# Patient Record
Sex: Female | Born: 1945 | Race: White | Hispanic: No | Marital: Married | Smoking: Former smoker
Health system: Southern US, Community
[De-identification: ages and names within clinical notes are randomized; demographics above are authoritative.]

## PROBLEM LIST (undated history)

## (undated) DIAGNOSIS — R112 Nausea with vomiting, unspecified: Secondary | ICD-10-CM

## (undated) DIAGNOSIS — M549 Dorsalgia, unspecified: Secondary | ICD-10-CM

## (undated) DIAGNOSIS — M25511 Pain in right shoulder: Secondary | ICD-10-CM

## (undated) DIAGNOSIS — G5600 Carpal tunnel syndrome, unspecified upper limb: Secondary | ICD-10-CM

## (undated) DIAGNOSIS — D494 Neoplasm of unspecified behavior of bladder: Secondary | ICD-10-CM

## (undated) DIAGNOSIS — I2510 Atherosclerotic heart disease of native coronary artery without angina pectoris: Secondary | ICD-10-CM

## (undated) DIAGNOSIS — R350 Frequency of micturition: Secondary | ICD-10-CM

## (undated) DIAGNOSIS — R351 Nocturia: Secondary | ICD-10-CM

## (undated) DIAGNOSIS — E538 Deficiency of other specified B group vitamins: Secondary | ICD-10-CM

## (undated) DIAGNOSIS — K760 Fatty (change of) liver, not elsewhere classified: Secondary | ICD-10-CM

## (undated) DIAGNOSIS — C801 Malignant (primary) neoplasm, unspecified: Secondary | ICD-10-CM

## (undated) DIAGNOSIS — E119 Type 2 diabetes mellitus without complications: Secondary | ICD-10-CM

## (undated) DIAGNOSIS — R519 Headache, unspecified: Secondary | ICD-10-CM

## (undated) DIAGNOSIS — N6019 Diffuse cystic mastopathy of unspecified breast: Secondary | ICD-10-CM

## (undated) DIAGNOSIS — T8859XA Other complications of anesthesia, initial encounter: Secondary | ICD-10-CM

## (undated) DIAGNOSIS — R7303 Prediabetes: Secondary | ICD-10-CM

## (undated) DIAGNOSIS — E782 Mixed hyperlipidemia: Secondary | ICD-10-CM

## (undated) DIAGNOSIS — M47819 Spondylosis without myelopathy or radiculopathy, site unspecified: Secondary | ICD-10-CM

## (undated) DIAGNOSIS — Z8719 Personal history of other diseases of the digestive system: Secondary | ICD-10-CM

## (undated) DIAGNOSIS — Z8551 Personal history of malignant neoplasm of bladder: Secondary | ICD-10-CM

## (undated) DIAGNOSIS — T884XXA Failed or difficult intubation, initial encounter: Secondary | ICD-10-CM

## (undated) DIAGNOSIS — R32 Unspecified urinary incontinence: Secondary | ICD-10-CM

## (undated) DIAGNOSIS — K449 Diaphragmatic hernia without obstruction or gangrene: Secondary | ICD-10-CM

## (undated) DIAGNOSIS — C679 Malignant neoplasm of bladder, unspecified: Secondary | ICD-10-CM

## (undated) DIAGNOSIS — G8929 Other chronic pain: Secondary | ICD-10-CM

## (undated) DIAGNOSIS — K219 Gastro-esophageal reflux disease without esophagitis: Secondary | ICD-10-CM

## (undated) DIAGNOSIS — K661 Hemoperitoneum: Secondary | ICD-10-CM

## (undated) DIAGNOSIS — F419 Anxiety disorder, unspecified: Secondary | ICD-10-CM

## (undated) DIAGNOSIS — I1A0 Resistant hypertension: Secondary | ICD-10-CM

## (undated) DIAGNOSIS — H269 Unspecified cataract: Secondary | ICD-10-CM

## (undated) DIAGNOSIS — J189 Pneumonia, unspecified organism: Secondary | ICD-10-CM

## (undated) DIAGNOSIS — R159 Full incontinence of feces: Secondary | ICD-10-CM

## (undated) DIAGNOSIS — I493 Ventricular premature depolarization: Secondary | ICD-10-CM

## (undated) DIAGNOSIS — L409 Psoriasis, unspecified: Secondary | ICD-10-CM

## (undated) DIAGNOSIS — Z889 Allergy status to unspecified drugs, medicaments and biological substances status: Secondary | ICD-10-CM

## (undated) DIAGNOSIS — H53123 Transient visual loss, bilateral: Secondary | ICD-10-CM

## (undated) DIAGNOSIS — M1990 Unspecified osteoarthritis, unspecified site: Secondary | ICD-10-CM

## (undated) DIAGNOSIS — I10 Essential (primary) hypertension: Secondary | ICD-10-CM

## (undated) DIAGNOSIS — I739 Peripheral vascular disease, unspecified: Secondary | ICD-10-CM

## (undated) DIAGNOSIS — Z955 Presence of coronary angioplasty implant and graft: Secondary | ICD-10-CM

## (undated) DIAGNOSIS — R399 Unspecified symptoms and signs involving the genitourinary system: Secondary | ICD-10-CM

## (undated) DIAGNOSIS — M545 Low back pain, unspecified: Secondary | ICD-10-CM

## (undated) DIAGNOSIS — Z9889 Other specified postprocedural states: Secondary | ICD-10-CM

## (undated) DIAGNOSIS — K2270 Barrett's esophagus without dysplasia: Secondary | ICD-10-CM

## (undated) DIAGNOSIS — K5790 Diverticulosis of intestine, part unspecified, without perforation or abscess without bleeding: Secondary | ICD-10-CM

## (undated) DIAGNOSIS — I679 Cerebrovascular disease, unspecified: Secondary | ICD-10-CM

## (undated) DIAGNOSIS — E785 Hyperlipidemia, unspecified: Secondary | ICD-10-CM

## (undated) HISTORY — PX: CARDIAC CATHETERIZATION: SHX172

## (undated) HISTORY — PX: EYE SURGERY: SHX253

## (undated) HISTORY — PX: COLONOSCOPY: SHX174

## (undated) HISTORY — PX: CORONARY ANGIOPLASTY WITH STENT PLACEMENT: SHX49

## (undated) HISTORY — PX: BACK SURGERY: SHX140

## (undated) HISTORY — PX: APPENDECTOMY: SHX54

---
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
## 1974-05-10 HISTORY — PX: VAGINAL HYSTERECTOMY: SUR661

## 1988-05-10 HISTORY — PX: CHOLECYSTECTOMY: SHX55

## 1988-05-10 HISTORY — PX: CHOLECYSTECTOMY OPEN: SUR202

## 2003-05-11 HISTORY — PX: HAND SURGERY: SHX662

## 2003-05-11 HISTORY — PX: CARPAL TUNNEL RELEASE: SHX101

## 2004-10-01 ENCOUNTER — Ambulatory Visit: Payer: Self-pay | Admitting: Internal Medicine

## 2004-11-18 ENCOUNTER — Ambulatory Visit: Payer: Self-pay | Admitting: Unknown Physician Specialty

## 2005-06-30 ENCOUNTER — Ambulatory Visit: Payer: Self-pay | Admitting: Internal Medicine

## 2007-02-08 HISTORY — PX: CORONARY ANGIOPLASTY WITH STENT PLACEMENT: SHX49

## 2007-03-01 ENCOUNTER — Other Ambulatory Visit: Payer: Self-pay

## 2007-03-01 ENCOUNTER — Inpatient Hospital Stay: Payer: Self-pay | Admitting: Internal Medicine

## 2007-07-24 ENCOUNTER — Ambulatory Visit: Payer: Self-pay | Admitting: Unknown Physician Specialty

## 2007-07-26 ENCOUNTER — Ambulatory Visit: Payer: Self-pay | Admitting: Unknown Physician Specialty

## 2008-01-09 HISTORY — PX: CORONARY ANGIOPLASTY WITH STENT PLACEMENT: SHX49

## 2008-09-17 HISTORY — PX: CARDIAC CATHETERIZATION: SHX172

## 2009-06-23 ENCOUNTER — Ambulatory Visit: Payer: Self-pay | Admitting: Internal Medicine

## 2009-07-10 HISTORY — PX: CAROTID ENDARTERECTOMY: SUR193

## 2009-07-16 ENCOUNTER — Emergency Department: Payer: Self-pay | Admitting: Unknown Physician Specialty

## 2010-12-08 ENCOUNTER — Ambulatory Visit: Payer: Self-pay | Admitting: Family Medicine

## 2011-01-06 ENCOUNTER — Ambulatory Visit: Payer: Self-pay | Admitting: Neurosurgery

## 2011-02-03 ENCOUNTER — Ambulatory Visit: Payer: Self-pay | Admitting: Neurosurgery

## 2011-05-11 DIAGNOSIS — Z8719 Personal history of other diseases of the digestive system: Secondary | ICD-10-CM

## 2011-05-11 HISTORY — DX: Personal history of other diseases of the digestive system: Z87.19

## 2011-05-26 ENCOUNTER — Encounter (HOSPITAL_COMMUNITY): Payer: Self-pay | Admitting: Pharmacy Technician

## 2011-06-04 ENCOUNTER — Encounter (HOSPITAL_COMMUNITY): Payer: Self-pay

## 2011-06-04 ENCOUNTER — Encounter (HOSPITAL_COMMUNITY)
Admission: RE | Admit: 2011-06-04 | Discharge: 2011-06-04 | Disposition: A | Discharge status: Home / Self Care | Payer: Medicare Other | Source: Ambulatory Visit | Attending: Anesthesiology | Admitting: Anesthesiology

## 2011-06-04 ENCOUNTER — Encounter (HOSPITAL_COMMUNITY)
Admission: RE | Admit: 2011-06-04 | Discharge: 2011-06-04 | Disposition: A | Discharge status: Home / Self Care | Payer: Medicare Other | Source: Ambulatory Visit | Attending: Neurosurgery | Admitting: Neurosurgery

## 2011-06-04 ENCOUNTER — Encounter (HOSPITAL_COMMUNITY): Payer: Self-pay | Admitting: Vascular Surgery

## 2011-06-04 HISTORY — DX: Diverticulosis of intestine, part unspecified, without perforation or abscess without bleeding: K57.90

## 2011-06-04 HISTORY — DX: Nocturia: R35.1

## 2011-06-04 HISTORY — DX: Frequency of micturition: R35.0

## 2011-06-04 HISTORY — DX: Psoriasis, unspecified: L40.9

## 2011-06-04 HISTORY — DX: Other specified postprocedural states: R11.2

## 2011-06-04 HISTORY — DX: Atherosclerotic heart disease of native coronary artery without angina pectoris: I25.10

## 2011-06-04 HISTORY — DX: Other specified postprocedural states: Z98.890

## 2011-06-04 HISTORY — DX: Essential (primary) hypertension: I10

## 2011-06-04 HISTORY — DX: Gastro-esophageal reflux disease without esophagitis: K21.9

## 2011-06-04 HISTORY — DX: Pneumonia, unspecified organism: J18.9

## 2011-06-04 HISTORY — DX: Other chronic pain: G89.29

## 2011-06-04 HISTORY — DX: Personal history of other diseases of the digestive system: Z87.19

## 2011-06-04 HISTORY — DX: Unspecified osteoarthritis, unspecified site: M19.90

## 2011-06-04 HISTORY — DX: Dorsalgia, unspecified: M54.9

## 2011-06-04 HISTORY — DX: Hyperlipidemia, unspecified: E78.5

## 2011-06-04 LAB — BASIC METABOLIC PANEL
BUN: 13 mg/dL (ref 6–23)
GFR calc Af Amer: >90 mL/min (ref >90)
GFR calc non Af Amer: >90 mL/min (ref >90)
Potassium: 3.5 mEq/L (ref 3.5–5.1)
Sodium: 141 mEq/L (ref 135–145)

## 2011-06-04 LAB — CBC
Hemoglobin: 13.9 g/dL (ref 12.0–15.0)
MCHC: 33.6 g/dL (ref 30.0–36.0)
Platelets: 233 10*3/uL (ref 150–400)
RBC: 4.3 MIL/uL (ref 3.87–5.11)

## 2011-06-04 LAB — SURGICAL PCR SCREEN
MRSA, PCR: NEGATIVE
Staphylococcus aureus: NEGATIVE

## 2011-06-04 NOTE — Progress Notes (Addendum)
Stress test done in 2008-[facility]  Echo done in 2008-to request from [facility]  Heart cath done in 2008 to request from [facility]  Requested anesthesia records from [facility] from Carotid Artery Surgery as well as heart cath in 2009

## 2011-06-04 NOTE — Anesthesia Preprocedure Evaluation (Addendum)
Anesthesia Evaluation  Patient identified by MRN, date of birth, ID band Patient awake    Reviewed: Allergy & Precautions, H&P , NPO status , Patient's Chart, lab work & pertinent test results, reviewed documented beta blocker date and time   History of Anesthesia Complications (+) DIFFICULT AIRWAY  Airway Mallampati: II TM Distance: >3 FB Neck ROM: Full    Dental  (+) Dental Advisory Given and Teeth Intact   Pulmonary  clear to auscultation        Cardiovascular hypertension, Pt. on medications and Pt. on home beta blockers CAD: Recent cath showed no CAD. Regular Normal    Neuro/Psych  Headaches,    GI/Hepatic hiatal hernia, GERD-  Medicated and Controlled,  Endo/Other  Hyperlipidemia  Renal/GU      Musculoskeletal   Abdominal   Peds  Hematology   Anesthesia Other Findings Hx of Difficult intubation at [facility] with finding of "crook" in lower airway and  Post op hoarseness.  Seems resolved now.  Reproductive/Obstetrics                       Anesthesia Physical Anesthesia Plan  ASA: II  Anesthesia Plan: General   Post-op Pain Management:    Induction: Intravenous  Airway Management Planned: Oral ETT  Additional Equipment:   Intra-op Plan:   Post-operative Plan: Extubation in OR  Informed Consent: I have reviewed the patients History and Physical, chart, labs and discussed the procedure including the risks, benefits and alternatives for the proposed anesthesia with the patient or authorized representative who has indicated his/her understanding and acceptance.   Dental advisory given  Plan Discussed with: CRNA, Anesthesiologist and Surgeon  Anesthesia Plan Comments: (See Anesthesia consult note.  History of DIFFICULT INTUBATION.  Shonna Chock, PA-C)      Anesthesia Quick Evaluation

## 2011-06-04 NOTE — Consult Note (Signed)
Anesthesia:  Patient is a 66 year old female scheduled for a L2-3, L4-5 lumbar laminectomy on 06/07/11 (Monday).  She is a first case, and her PAT appointment was just this afternoon (Friday).  Her history includes difficult intubation, CAD s/p stent X 2 (DES to mid LAD '08 and '09), amaurosis fugax s/p right CEA in 2011 (Dr. Magda Bernheim),  HLD, HTN, PNA 03/2011, osteoporosis, psoriasis, GERD, HH, cataracts, and "bleed easily" related to anti-platelet medications.  Her Cardiologist is Dr. Emilio Math at [facility].  She just saw him on 05/26/11 for "clearance." She was told that she could hold her Plavix, but not her ASA and says Dr. Wynetta Emery is aware of this.  We are still awaiting [facility] records.  According to her 2008 cath at [facility] her EF was 65%.  Her EKG from [facility] on 05/26/11 showed NSR, possible inferior infarct.  She denies CP/SOB or any new symptoms since her recent Cardiac evaluation.   In regards to her airway history, she reports that she has had multiple operative procedures under [facility] and had never been told that she was a difficult intubation until her carotid surgery at [facility] in 2011.  She says her Anesthesiologist Dr. Hyacinth Meeker told her that he had a difficult time with her intubation due to a "crook" in her airway.  She has continued hoarseness.  Of note, she did have to be re-admitted overnight 6 days following her CEA due to SOB, but did not require intubation.  I discussed various means for intubation in persons with a difficult airway history and updated Dr. Ivin Booty as well.  The patient is comfortable with further evaluation by her assigned Anesthesiologist on the day of surgery to determine her definitive anesthesia plan.  The PAT RN has already requested Cardiac and Anesthesia records from [facility], but they are still pending.  Today's labs and CXR are unremarkable.  Dr. Lonie Peak office is currently closed.  Addendum: 06/03/10 1645  Notes received from [facility].  They did not send her OV note from 05/26/11, but  there is a Cardiology note from 07/22/10.  Her last cardiac cath was actually done on 05/14/10 and showed normal coronary arteries.    Anesthesia records from her right CEA surgery on 07/10/09 were also sent.  According to notes "patient a grade 2 view with Vear Clock 2, grade 1 with MAC 4 with significant cricoid pressure.  Recognized esophageal intubation followed by intubation with bougie."  A cuffed ETT size 7.5 was utilized.

## 2011-06-04 NOTE — Progress Notes (Signed)
Clarified with Erie Noe at [facility] for Ancef 1gm IV to OR

## 2011-06-04 NOTE — Pre-Procedure Instructions (Signed)
19 Meagan Osborne  06/04/2011   Your procedure is scheduled on:  Mon, Jan 28 @ 0730  Report to [facility] at 0530 AM.  Call this number if you have problems the morning of surgery: [phone]   Remember:   Do not eat food:After Midnight.  May have clear liquids: up to 4 Hours before arrival.(until 1:30am)  Clear liquids include soda, tea, black coffee, apple or grape juice, broth.  Take these medicines the morning of surgery with A SIP OF WATER: Carvedilol,Nexium   Do not wear jewelry, make-up or nail polish.  Do not wear lotions, powders, or perfumes. You may wear deodorant.  Do not shave 48 hours prior to surgery.  Do not bring valuables to the hospital.  Contacts, dentures or bridgework may not be worn into surgery.  Leave suitcase in the car. After surgery it may be brought to your room.  For patients admitted to the hospital, checkout time is 11:00 AM the day of discharge.   Patients discharged the day of surgery will not be allowed to drive home.  Name and phone number of your driver:   Special Instructions: CHG Shower Use Special Wash: 1/2 bottle night before surgery and 1/2 bottle morning of surgery.   Please read over the following fact sheets that you were given: Pain Booklet, Coughing and Deep Breathing, MRSA Information and Surgical Site Infection Prevention

## 2011-06-04 NOTE — Progress Notes (Signed)
Pts cardiologist is Dr.Michael Sketch at [facility] [place]-last visit a week ago;was told that she needed to stop Plavix 5days prior to surgery and under no circumstances to stop [phone]

## 2011-06-04 NOTE — Progress Notes (Signed)
Last office visit 05/2011-to request OV from Dr.Sketch [phone]

## 2011-06-06 MED ORDER — CEFAZOLIN SODIUM 1-5 GM-% IV SOLN
1.0000 g | Freq: Once | INTRAVENOUS | Status: DC
  Filled 2011-06-06: qty 50

## 2011-06-07 ENCOUNTER — Encounter (HOSPITAL_COMMUNITY): Payer: Self-pay | Admitting: *Deleted

## 2011-06-07 ENCOUNTER — Ambulatory Visit (HOSPITAL_COMMUNITY): Payer: Medicare Other | Admitting: Vascular Surgery

## 2011-06-07 ENCOUNTER — Encounter (HOSPITAL_COMMUNITY): Payer: Self-pay | Admitting: Vascular Surgery

## 2011-06-07 ENCOUNTER — Inpatient Hospital Stay (HOSPITAL_COMMUNITY)
Admission: RE | Admit: 2011-06-07 | Discharge: 2011-06-09 | DRG: 491 | Disposition: A | Discharge status: Home / Self Care | Payer: Medicare Other | Source: Ambulatory Visit | Attending: Neurosurgery | Admitting: Neurosurgery

## 2011-06-07 ENCOUNTER — Encounter (HOSPITAL_COMMUNITY)
Admission: RE | Disposition: A | Discharge status: Home / Self Care | Payer: Self-pay | Source: Ambulatory Visit | Attending: Neurosurgery

## 2011-06-07 ENCOUNTER — Ambulatory Visit (HOSPITAL_COMMUNITY): Payer: Medicare Other

## 2011-06-07 DIAGNOSIS — E785 Hyperlipidemia, unspecified: Secondary | ICD-10-CM | POA: Diagnosis present

## 2011-06-07 DIAGNOSIS — G8929 Other chronic pain: Secondary | ICD-10-CM | POA: Diagnosis present

## 2011-06-07 DIAGNOSIS — I959 Hypotension, unspecified: Secondary | ICD-10-CM | POA: Diagnosis not present

## 2011-06-07 DIAGNOSIS — Z8701 Personal history of pneumonia (recurrent): Secondary | ICD-10-CM

## 2011-06-07 DIAGNOSIS — Z90710 Acquired absence of both cervix and uterus: Secondary | ICD-10-CM

## 2011-06-07 DIAGNOSIS — Z9861 Coronary angioplasty status: Secondary | ICD-10-CM

## 2011-06-07 DIAGNOSIS — M48061 Spinal stenosis, lumbar region without neurogenic claudication: Principal | ICD-10-CM | POA: Diagnosis present

## 2011-06-07 DIAGNOSIS — Z7902 Long term (current) use of antithrombotics/antiplatelets: Secondary | ICD-10-CM

## 2011-06-07 DIAGNOSIS — I10 Essential (primary) hypertension: Secondary | ICD-10-CM | POA: Diagnosis present

## 2011-06-07 DIAGNOSIS — I2510 Atherosclerotic heart disease of native coronary artery without angina pectoris: Secondary | ICD-10-CM | POA: Diagnosis present

## 2011-06-07 DIAGNOSIS — Z91013 Allergy to seafood: Secondary | ICD-10-CM

## 2011-06-07 DIAGNOSIS — Z883 Allergy status to other anti-infective agents status: Secondary | ICD-10-CM

## 2011-06-07 DIAGNOSIS — D649 Anemia, unspecified: Secondary | ICD-10-CM | POA: Diagnosis not present

## 2011-06-07 DIAGNOSIS — Z01812 Encounter for preprocedural laboratory examination: Secondary | ICD-10-CM

## 2011-06-07 DIAGNOSIS — K219 Gastro-esophageal reflux disease without esophagitis: Secondary | ICD-10-CM | POA: Diagnosis present

## 2011-06-07 DIAGNOSIS — Z882 Allergy status to sulfonamides status: Secondary | ICD-10-CM

## 2011-06-07 HISTORY — PX: LUMBAR LAMINECTOMY/DECOMPRESSION MICRODISCECTOMY: SHX5026

## 2011-06-07 SURGERY — LUMBAR LAMINECTOMY/DECOMPRESSION MICRODISCECTOMY 2 LEVELS
Anesthesia: General | Site: Back | Laterality: Left | Wound class: Clean

## 2011-06-07 MED ORDER — THERA M PLUS PO TABS
1.0000 | ORAL_TABLET | Freq: Every day | ORAL | Status: DC
  Administered 2011-06-07 – 2011-06-08 (×2): 1 via ORAL
  Filled 2011-06-07 (×3): qty 1

## 2011-06-07 MED ORDER — THROMBIN 5000 UNITS EX SOLR
OROMUCOSAL | Status: DC | PRN
  Administered 2011-06-07: 09:00:00 via TOPICAL

## 2011-06-07 MED ORDER — PANTOPRAZOLE SODIUM 40 MG PO TBEC
40.0000 mg | DELAYED_RELEASE_TABLET | Freq: Every day | ORAL | Status: DC
  Administered 2011-06-08: 40 mg via ORAL
  Filled 2011-06-07: qty 1

## 2011-06-07 MED ORDER — SODIUM CHLORIDE 0.9 % IR SOLN
Status: DC | PRN
  Administered 2011-06-07: 08:00:00

## 2011-06-07 MED ORDER — LIDOCAINE-EPINEPHRINE 1 %-1:100000 IJ SOLN
INTRAMUSCULAR | Status: DC | PRN
  Administered 2011-06-07: 10 mL

## 2011-06-07 MED ORDER — LACTATED RINGERS IV SOLN
INTRAVENOUS | Status: DC | PRN
  Administered 2011-06-07 (×2): via INTRAVENOUS

## 2011-06-07 MED ORDER — ACETAMINOPHEN 325 MG PO TABS
650.0000 mg | ORAL_TABLET | ORAL | Status: DC | PRN
  Administered 2011-06-08: 650 mg via ORAL
  Filled 2011-06-07: qty 2

## 2011-06-07 MED ORDER — OXYCODONE-ACETAMINOPHEN 5-325 MG PO TABS
1.0000 | ORAL_TABLET | ORAL | Status: DC | PRN
  Administered 2011-06-07 (×2): 1 via ORAL
  Administered 2011-06-08 – 2011-06-09 (×3): 2 via ORAL
  Filled 2011-06-07: qty 1
  Filled 2011-06-07 (×2): qty 2
  Filled 2011-06-07: qty 1
  Filled 2011-06-07: qty 2

## 2011-06-07 MED ORDER — 0.9 % SODIUM CHLORIDE (POUR BTL) OPTIME
TOPICAL | Status: DC | PRN
  Administered 2011-06-07: 1000 mL

## 2011-06-07 MED ORDER — EPHEDRINE SULFATE 50 MG/ML IJ SOLN
INTRAMUSCULAR | Status: DC | PRN
  Administered 2011-06-07: 10 mg via INTRAVENOUS

## 2011-06-07 MED ORDER — DIPHENHYDRAMINE HCL 50 MG/ML IJ SOLN
INTRAMUSCULAR | Status: AC
  Administered 2011-06-07: 12.5 mg
  Filled 2011-06-07: qty 1

## 2011-06-07 MED ORDER — MIDAZOLAM HCL 5 MG/5ML IJ SOLN
INTRAMUSCULAR | Status: DC | PRN
  Administered 2011-06-07: 2 mg via INTRAVENOUS

## 2011-06-07 MED ORDER — PHENOL 1.4 % MT LIQD: 1.0000 | OROMUCOSAL | Status: DC | PRN

## 2011-06-07 MED ORDER — SODIUM CHLORIDE 0.9 % IV SOLN
INTRAVENOUS | Status: AC
Start: 2011-06-07 — End: 2011-06-07
  Filled 2011-06-07: qty 500

## 2011-06-07 MED ORDER — DIPHENHYDRAMINE HCL 25 MG PO CAPS
25.0000 mg | ORAL_CAPSULE | Freq: Four times a day (QID) | ORAL | Status: DC | PRN
Start: 2011-06-07 — End: 2011-06-09
  Administered 2011-06-07 – 2011-06-08 (×2): 25 mg via ORAL
  Filled 2011-06-07 (×2): qty 1

## 2011-06-07 MED ORDER — THROMBIN 5000 UNITS EX KIT
PACK | CUTANEOUS | Status: DC | PRN
  Administered 2011-06-07 (×3): 5000 [IU] via TOPICAL

## 2011-06-07 MED ORDER — SIMVASTATIN 20 MG PO TABS: 20.0000 mg | ORAL_TABLET | Freq: Every day | ORAL | Status: DC

## 2011-06-07 MED ORDER — ASPIRIN EC 81 MG PO TBEC
81.0000 mg | DELAYED_RELEASE_TABLET | Freq: Every day | ORAL | Status: DC
Start: 2011-06-07 — End: 2011-06-09
  Administered 2011-06-07 – 2011-06-08 (×2): 81 mg via ORAL
  Filled 2011-06-07 (×3): qty 1

## 2011-06-07 MED ORDER — ONDANSETRON HCL 4 MG/2ML IJ SOLN
INTRAMUSCULAR | Status: DC | PRN
  Administered 2011-06-07: 4 mg via INTRAVENOUS

## 2011-06-07 MED ORDER — NEOSTIGMINE METHYLSULFATE 1 MG/ML IJ SOLN
INTRAMUSCULAR | Status: DC | PRN
  Administered 2011-06-07: 4 mg via INTRAVENOUS

## 2011-06-07 MED ORDER — GLYCOPYRROLATE 0.2 MG/ML IJ SOLN
INTRAMUSCULAR | Status: DC | PRN
  Administered 2011-06-07: .6 mg via INTRAVENOUS

## 2011-06-07 MED ORDER — CEFAZOLIN SODIUM 1-5 GM-% IV SOLN
1.0000 g | Freq: Three times a day (TID) | INTRAVENOUS | Status: AC
  Administered 2011-06-07 – 2011-06-08 (×2): 1 g via INTRAVENOUS
  Filled 2011-06-07 (×2): qty 50

## 2011-06-07 MED ORDER — FENTANYL CITRATE 0.05 MG/ML IJ SOLN
INTRAMUSCULAR | Status: DC | PRN
  Administered 2011-06-07: 100 ug via INTRAVENOUS

## 2011-06-07 MED ORDER — HEMOSTATIC AGENTS (NO CHARGE) OPTIME
TOPICAL | Status: DC | PRN
  Administered 2011-06-07: 1 via TOPICAL

## 2011-06-07 MED ORDER — MENTHOL 3 MG MT LOZG: 1.0000 | LOZENGE | OROMUCOSAL | Status: DC | PRN

## 2011-06-07 MED ORDER — PROPOFOL 10 MG/ML IV EMUL
INTRAVENOUS | Status: DC | PRN
  Administered 2011-06-07: 200 mg via INTRAVENOUS

## 2011-06-07 MED ORDER — CEFAZOLIN SODIUM 1-5 GM-% IV SOLN
INTRAVENOUS | Status: DC | PRN
  Administered 2011-06-07: 1 g via INTRAVENOUS

## 2011-06-07 MED ORDER — HYDROCODONE-ACETAMINOPHEN 5-325 MG PO TABS
ORAL_TABLET | ORAL | Status: AC
  Filled 2011-06-07: qty 2

## 2011-06-07 MED ORDER — DIPHENHYDRAMINE HCL 50 MG/ML IJ SOLN: 12.5000 mg | Freq: Four times a day (QID) | INTRAMUSCULAR | Status: DC | PRN

## 2011-06-07 MED ORDER — ONDANSETRON HCL 4 MG/2ML IJ SOLN: 4.0000 mg | Freq: Once | INTRAMUSCULAR | Status: DC | PRN

## 2011-06-07 MED ORDER — OXYCODONE-ACETAMINOPHEN 5-325 MG PO TABS
ORAL_TABLET | ORAL | Status: AC
  Filled 2011-06-07: qty 2

## 2011-06-07 MED ORDER — CYCLOBENZAPRINE HCL 10 MG PO TABS
10.0000 mg | ORAL_TABLET | Freq: Three times a day (TID) | ORAL | Status: DC | PRN
  Administered 2011-06-07 – 2011-06-09 (×3): 10 mg via ORAL
  Filled 2011-06-07 (×3): qty 1

## 2011-06-07 MED ORDER — HYDROMORPHONE HCL PF 1 MG/ML IJ SOLN
0.2500 mg | INTRAMUSCULAR | Status: DC | PRN
  Administered 2011-06-07 (×2): 0.5 mg via INTRAVENOUS

## 2011-06-07 MED ORDER — HYDROMORPHONE HCL PF 1 MG/ML IJ SOLN
0.5000 mg | INTRAMUSCULAR | Status: DC | PRN
  Administered 2011-06-07 (×2): 1 mg via INTRAVENOUS
  Filled 2011-06-07 (×2): qty 1

## 2011-06-07 MED ORDER — CLOPIDOGREL BISULFATE 75 MG PO TABS
75.0000 mg | ORAL_TABLET | Freq: Every day | ORAL | Status: DC
  Administered 2011-06-07 – 2011-06-08 (×2): 75 mg via ORAL
  Filled 2011-06-07 (×3): qty 1

## 2011-06-07 MED ORDER — BACITRACIN 50000 UNITS IM SOLR
INTRAMUSCULAR | Status: AC
  Filled 2011-06-07: qty 1

## 2011-06-07 MED ORDER — MORPHINE SULFATE 2 MG/ML IJ SOLN: 0.0500 mg/kg | INTRAMUSCULAR | Status: DC | PRN

## 2011-06-07 MED ORDER — SODIUM CHLORIDE 0.9 % IJ SOLN: 3.0000 mL | Freq: Two times a day (BID) | INTRAMUSCULAR | Status: DC

## 2011-06-07 MED ORDER — ROCURONIUM BROMIDE 100 MG/10ML IV SOLN
INTRAVENOUS | Status: DC | PRN
  Administered 2011-06-07: 40 mg via INTRAVENOUS

## 2011-06-07 MED ORDER — SUCCINYLCHOLINE CHLORIDE 20 MG/ML IJ SOLN
INTRAMUSCULAR | Status: DC | PRN
  Administered 2011-06-07: 100 mg via INTRAVENOUS

## 2011-06-07 MED ORDER — CARVEDILOL 25 MG PO TABS
25.0000 mg | ORAL_TABLET | Freq: Two times a day (BID) | ORAL | Status: DC
  Administered 2011-06-07: 25 mg via ORAL
  Filled 2011-06-07 (×6): qty 1

## 2011-06-07 MED ORDER — BUPIVACAINE HCL (PF) 0.25 % IJ SOLN
INTRAMUSCULAR | Status: DC | PRN
  Administered 2011-06-07: 20 mL

## 2011-06-07 MED ORDER — ACETAMINOPHEN 650 MG RE SUPP: 650.0000 mg | RECTAL | Status: DC | PRN

## 2011-06-07 MED ORDER — ONDANSETRON HCL 4 MG/2ML IJ SOLN: 4.0000 mg | INTRAMUSCULAR | Status: DC | PRN

## 2011-06-07 MED ORDER — NITROGLYCERIN 0.4 MG SL SUBL: 0.4000 mg | SUBLINGUAL_TABLET | SUBLINGUAL | Status: DC | PRN

## 2011-06-07 MED ORDER — MEPERIDINE HCL 25 MG/ML IJ SOLN: 6.2500 mg | INTRAMUSCULAR | Status: DC | PRN

## 2011-06-07 MED ORDER — PANTOPRAZOLE SODIUM 40 MG IV SOLR
40.0000 mg | Freq: Every day | INTRAVENOUS | Status: DC
  Administered 2011-06-08: 40 mg via INTRAVENOUS
  Filled 2011-06-07 (×3): qty 40

## 2011-06-07 MED ORDER — ATORVASTATIN CALCIUM 40 MG PO TABS
40.0000 mg | ORAL_TABLET | Freq: Every day | ORAL | Status: DC
  Administered 2011-06-07: 40 mg via ORAL
  Filled 2011-06-07 (×3): qty 1

## 2011-06-07 MED ORDER — HYDROMORPHONE HCL PF 1 MG/ML IJ SOLN
INTRAMUSCULAR | Status: AC
  Filled 2011-06-07: qty 1

## 2011-06-07 MED ORDER — BENAZEPRIL HCL 40 MG PO TABS
40.0000 mg | ORAL_TABLET | Freq: Every day | ORAL | Status: DC
  Administered 2011-06-07: 40 mg via ORAL
  Filled 2011-06-07 (×3): qty 1

## 2011-06-07 SURGICAL SUPPLY — 57 items
BAG DECANTER FOR FLEXI CONT (MISCELLANEOUS) ×2 IMPLANT
BENZOIN TINCTURE PRP APPL 2/3 (GAUZE/BANDAGES/DRESSINGS) ×2 IMPLANT
BLADE SURG 11 STRL SS (BLADE) ×2 IMPLANT
BLADE SURG ROTATE 9660 (MISCELLANEOUS) IMPLANT
BRUSH SCRUB EZ PLAIN DRY (MISCELLANEOUS) ×2 IMPLANT
BUR MATCHSTICK NEURO 3.0 LAGG (BURR) ×2 IMPLANT
BUR PRECISION FLUTE 6.0 (BURR) ×2 IMPLANT
CANISTER SUCTION 2500CC (MISCELLANEOUS) ×2 IMPLANT
CLOSURE STERI STRIP 1/2 X4 (GAUZE/BANDAGES/DRESSINGS) ×2 IMPLANT
CLOTH BEACON ORANGE TIMEOUT ST (SAFETY) ×2 IMPLANT
CONT SPEC 4OZ CLIKSEAL STRL BL (MISCELLANEOUS) ×2 IMPLANT
DECANTER SPIKE VIAL GLASS SM (MISCELLANEOUS) ×2 IMPLANT
DERMABOND ADVANCED (GAUZE/BANDAGES/DRESSINGS) ×1
DERMABOND ADVANCED .7 DNX12 (GAUZE/BANDAGES/DRESSINGS) ×1 IMPLANT
DRAPE LAPAROTOMY 100X72X124 (DRAPES) ×2 IMPLANT
DRAPE MICROSCOPE LEICA (MISCELLANEOUS) ×2 IMPLANT
DRAPE MICROSCOPE ZEISS OPMI (DRAPES) IMPLANT
DRAPE POUCH INSTRU U-SHP 10X18 (DRAPES) ×2 IMPLANT
DRAPE PROXIMA HALF (DRAPES) IMPLANT
DRAPE SURG 17X23 STRL (DRAPES) ×2 IMPLANT
DRSG OPSITE 4X5.5 SM (GAUZE/BANDAGES/DRESSINGS) ×2 IMPLANT
ELECT REM PT RETURN 9FT ADLT (ELECTROSURGICAL) ×2
ELECTRODE REM PT RTRN 9FT ADLT (ELECTROSURGICAL) ×1 IMPLANT
EVACUATOR 1/8 PVC DRAIN (DRAIN) ×2 IMPLANT
GAUZE SPONGE 4X4 16PLY XRAY LF (GAUZE/BANDAGES/DRESSINGS) ×2 IMPLANT
GLOVE BIO SURGEON STRL SZ8 (GLOVE) ×2 IMPLANT
GLOVE BIOGEL PI IND STRL 7.5 (GLOVE) ×2 IMPLANT
GLOVE BIOGEL PI INDICATOR 7.5 (GLOVE) ×2
GLOVE ECLIPSE 7.5 STRL STRAW (GLOVE) ×2 IMPLANT
GLOVE EXAM NITRILE LRG STRL (GLOVE) IMPLANT
GLOVE EXAM NITRILE MD LF STRL (GLOVE) ×2 IMPLANT
GLOVE EXAM NITRILE XL STR (GLOVE) IMPLANT
GLOVE EXAM NITRILE XS STR PU (GLOVE) IMPLANT
GLOVE INDICATOR 8.5 STRL (GLOVE) ×2 IMPLANT
GLOVE SURG SS PI 8.0 STRL IVOR (GLOVE) ×8 IMPLANT
GOWN BRE IMP SLV AUR LG STRL (GOWN DISPOSABLE) ×2 IMPLANT
GOWN BRE IMP SLV AUR XL STRL (GOWN DISPOSABLE) ×2 IMPLANT
GOWN STRL REIN 2XL LVL4 (GOWN DISPOSABLE) ×2 IMPLANT
HEMOSTAT POWDER SURGIFOAM 1G (HEMOSTASIS) ×2 IMPLANT
KIT BASIN OR (CUSTOM PROCEDURE TRAY) ×2 IMPLANT
KIT ROOM TURNOVER OR (KITS) ×2 IMPLANT
NEEDLE HYPO 22GX1.5 SAFETY (NEEDLE) ×2 IMPLANT
NEEDLE SPNL 22GX3.5 QUINCKE BK (NEEDLE) ×2 IMPLANT
NS IRRIG 1000ML POUR BTL (IV SOLUTION) ×2 IMPLANT
PACK LAMINECTOMY NEURO (CUSTOM PROCEDURE TRAY) ×2 IMPLANT
RUBBERBAND STERILE (MISCELLANEOUS) ×4 IMPLANT
SPONGE GAUZE 4X4 12PLY (GAUZE/BANDAGES/DRESSINGS) ×2 IMPLANT
SPONGE SURGIFOAM ABS GEL SZ50 (HEMOSTASIS) ×2 IMPLANT
STRIP CLOSURE SKIN 1/2X4 (GAUZE/BANDAGES/DRESSINGS) ×2 IMPLANT
SUT VIC AB 0 CT1 18XCR BRD8 (SUTURE) ×1 IMPLANT
SUT VIC AB 0 CT1 8-18 (SUTURE) ×1
SUT VIC AB 2-0 CT1 18 (SUTURE) ×2 IMPLANT
SUT VICRYL 4-0 PS2 18IN ABS (SUTURE) ×2 IMPLANT
SYR 20ML ECCENTRIC (SYRINGE) ×2 IMPLANT
TOWEL OR 17X24 6PK STRL BLUE (TOWEL DISPOSABLE) ×2 IMPLANT
TOWEL OR 17X26 10 PK STRL BLUE (TOWEL DISPOSABLE) ×2 IMPLANT
WATER STERILE IRR 1000ML POUR (IV SOLUTION) ×2 IMPLANT

## 2011-06-07 NOTE — Anesthesia Procedure Notes (Signed)
Procedure Name: Intubation Date/Time: 06/07/2011 7:57 AM Performed by: Shirlyn Goltz, TOM Pre-anesthesia Checklist: Patient identified, Emergency Drugs available, Suction available, Patient being monitored and Timeout performed Patient Re-evaluated:Patient Re-evaluated prior to inductionOxygen Delivery Method: Circle System Utilized Preoxygenation: Pre-oxygenation with 100% oxygen Intubation Type: IV induction Ventilation: Mask ventilation without difficulty Laryngoscope size: Glidescope. Grade View: Grade I Tube type: Oral Number of attempts: 1 Airway Equipment and Method: stylet and video-laryngoscopy Placement Confirmation: ETT inserted through vocal cords under direct vision,  positive ETCO2 and breath sounds checked- equal and bilateral Secured at: 21 cm Tube secured with: Tape Dental Injury: Teeth and Oropharynx as per pre-operative assessment  Comments: History of difficult intubation; no difficulty encountered with Glidescope.  Tube easily advanced.

## 2011-06-07 NOTE — H&P (Signed)
Meagan Osborne is an 66 y.o. female.   Chief Complaint: Back and left leg pain HPI: Patient reports 65 of them is a long-standing back and on the left leg pain pain would radiate into the front of her quad and occasional back of her legs.foot big toe fracture to all forms of conservative treatment with physical therapy anti-inflammatories epidural steroid injections and time the denies any right leg symptoms. She denies any bowel bladder difficulty there has been no position at cedar aggravated or remitted it. Workup revealed multilevel spondylosis but significant foraminal stenosis of both the 34 and V nerve roots coming off the 3445 disc spaces that she has transitional anatomy in a few count the sacralized vertebra as L5 and the nomenclature-year-old the L2-3 and L3-4 with a rudimentary disc L5. I recommended a to 3 and 34 left-sided laminectomy and foraminotomies recidivism The patient she understands.  Past Medical History  Diagnosis Date  . PONV (postoperative nausea and vomiting)   . Difficult intubation     pt states that anesthesia told her that she has a crooked airway  . Hyperlipidemia     takes Lipitor daily  . Hypertension     takes Benazepril daily and Carvedilol daily  . Pneumonia     in Dec 2012  . Hx of migraines     in early 20's  . Arthritis   . Joint pain   . Osteoporosis   . Chronic back pain   . Psoriasis   . GERD (gastroesophageal reflux disease)     takes Nexium daily  . H/O hiatal hernia   . Diverticulosis   . Urinary frequency   . Nocturia   . Bleeds easily     pt is on Plavix  . Bruises easily     takes Plavix   . Bilateral cataracts   . Coronary artery disease     stent X 1 in '08, stent X 1 '09    Past Surgical History  Procedure Date  . Tonsillectomy     as a child  . Adenoidectomy     as a child  . Cholecystectomy 1990  . Abdominal hysterectomy 1976  . Hand surgery     right hand  . Carotid artery surgery 2011  . Colonoscopy   . Cardiac  catheterization     Family History  Problem Relation Age of Onset  . Anesthesia problems Neg Hx   . Hypotension Neg Hx   . Malignant hyperthermia Neg Hx   . Pseudochol deficiency Neg Hx    Social History:  does not have a smoking history on file. She does not have any smokeless tobacco history on file. She reports that she does not drink alcohol or use illicit drugs.  Allergies:  Allergies  Allergen Reactions  . Shellfish Allergy Anaphylaxis  . Biaxin Hives  . Sulfa Antibiotics Hives    Medications Prior to Admission  Medication Dose Route Frequency Provider Last Rate Last Dose  . bacitracin 16109 UNITS injection           . ceFAZolin (ANCEF) IVPB 1 g/50 mL premix  1 g Intravenous Once Mariam Dollar, MD      . sodium chloride 0.9 % infusion            Medications Prior to Admission  Medication Sig Dispense Refill  . aspirin EC 81 MG tablet Take 81 mg by mouth daily.      Marland Kitchen atorvastatin (LIPITOR) 40 MG tablet Take 40 mg  by mouth daily.      . benazepril (LOTENSIN) 40 MG tablet Take 40 mg by mouth daily.      . carvedilol (COREG) 25 MG tablet Take 25 mg by mouth 2 (two) times daily with a meal.      . clopidogrel (PLAVIX) 75 MG tablet Take 75 mg by mouth daily.      Marland Kitchen esomeprazole (NEXIUM) 40 MG capsule Take 40 mg by mouth daily before breakfast.      . Multiple Vitamins-Minerals (MULTIVITAMINS THER. W/MINERALS) TABS Take 1 tablet by mouth daily.      . nitroGLYCERIN (NITROSTAT) 0.4 MG SL tablet Place 0.4 mg under the tongue every 5 (five) minutes as needed. For chest pain        No results found for this or any previous visit (from the past 48 hour(s)). No results found.  Review of Systems  Constitutional: Negative.   HENT: Negative.   Eyes: Negative.   Respiratory: Negative.   Cardiovascular: Negative.   Gastrointestinal: Negative.   Musculoskeletal: Positive for back pain and joint pain.  Skin: Negative.     Blood pressure 143/60, pulse 61, temperature 97.5 F (36.4  C), temperature source Oral, resp. rate 16, SpO2 95.00%. Physical Exam  Constitutional: She is oriented to person, place, and time. She appears well-developed and well-nourished.  HENT:  Head: Normocephalic and atraumatic.  Eyes: Pupils are equal, round, and reactive to light.  Neck: Normal range of motion.  Cardiovascular: Normal rate.   Respiratory: Breath sounds normal.  GI: Bowel sounds are normal.  Neurological: She is alert and oriented to person, place, and time.       Patient has 5 out of 5 strength in her right lower extremity in her left lower extremity she has 4/5 weakness of her iliopsoas and quadriceps as well as some weakness of her EHL at about 4/5     Assessment/Plan 66 year old female presents for an L2-3 and L3-4 left-sided decompressive laminectomy I've gone over extensively the risks and benefits of surgery, perioperative course, alternatives to surgery, expectations of outcome, he understands and agrees to proceed forward.  [name] P 06/07/2011, 7:35 AM

## 2011-06-07 NOTE — Op Note (Signed)
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  EBL: Less than 100  History of present illness: Patient is a 66 year old female who said long-standing back and prominent left buttock and leg pain rating down the anterior margins of her left thigh as well as down the outside of her left thigh to tear margins of her left shin. MRI scan showed transitional anatomy with a partially sacralized L5 vertebra and a rudimentary. L5-S1 disc space with the first intactabout this being name and L4-5. Stenosis was predominantly at the 2 disc spaces above this now noted at L2-3 and L3-4. As she has failed all forms of conservative treatment including physical therapy epidural steroid injections anti-inflammatories and time she was recommended decompression at these 2 levels the risks and benefits of the operation, alternatives to surgery, perioperative course, expectations of outcome were all explained to the patient she has agreed to proceed forward.  Operative procedure: Patient was brought into the or was induced under general anesthesia and positioned prone on the Wilson frame. Her back was prepped and draped in routine sterile fashion preoperative x-ray localize the appropriate levels after infiltration 10 cc lidocaine with epi a midline incision was made and Bovie left carpal was used to take down the subcutaneous tissues and subperiosteal dissection was carried out on the lamina of L3 and L4. Interoperative x-ray identified the L3 and L4 pedicle and L2-3 L3-4 disc space is at this point a high-speed drill was used to drill down the inferior aspect of the L2 lamina and the superior aspect of the L3 lamina as well as the inferior  aspect of the L3 lamina and superior aspect of the L4 lamina. Laminotomy was begun at L2-3 marching through and a complete laminectomy unilaterally at L3 on the left carried down to just below the L3-4 disc space. There is marked facet arthropathy and ligamentous hypertrophy causing hourglass compression of thecal sac at the level of the C3-4 disc space is. The dura was dissected off of the hypertrophied ligament and this was removed in piecemeal fashion decompressing the 34 and and to 3 disc spaces. Under microscopic illumination the L3 and L4 foramina were identified and the nerve roots were well decompressed out the foramina. The spaces were inspected and felt to be spondylitic but no herniation appreciated in noncompressive. After adequate under biting of the medial gutter and confirmed decompression from the level of the to 3 disc space down below the level XXXIV disc space, wounds and copiously irrigated meticulous hemostasis was maintained Gelfoam was overlaid top of the dura in the muscle and fascia reapproximated layers with after Vicryl and the skin was) 4 subcuticular. The wound was dressed patient recovered in stable condition at the end of the case all needle counts and sponge counts were correct.

## 2011-06-07 NOTE — Transfer of Care (Signed)
Immediate Anesthesia Transfer of Care Note  Patient: Meagan Osborne  Procedure(s) Performed:  LUMBAR LAMINECTOMY/DECOMPRESSION MICRODISCECTOMY 2 LEVELS -  Lumbar Two-Three, Lumbar Three-Four Decompressive Lumbar Laminectomy  Patient Location: PACU  Anesthesia Type: General  Level of Consciousness: awake, alert , oriented and patient cooperative  Airway & Oxygen Therapy: Patient Spontanous Breathing and Patient connected to nasal cannula oxygen  Post-op Assessment: Report given to PACU RN  Post vital signs: Reviewed and stable  Complications: No apparent anesthesia complications

## 2011-06-07 NOTE — Anesthesia Postprocedure Evaluation (Signed)
  Anesthesia Post-op Note  Patient: Meagan Osborne  Procedure(s) Performed:  LUMBAR LAMINECTOMY/DECOMPRESSION MICRODISCECTOMY 2 LEVELS -  Lumbar Two-Three, Lumbar Three-Four Decompressive Lumbar Laminectomy  Patient Location: PACU  Anesthesia Type: General  Level of Consciousness: awake, alert  and oriented  Airway and Oxygen Therapy: Patient Spontanous Breathing and Patient connected to nasal cannula oxygen  Post-op Pain: mild  Post-op Assessment: Post-op Vital signs reviewed, Patient's Cardiovascular Status Stable, Respiratory Function Stable, Patent Airway, No signs of Nausea or vomiting and Pain level controlled  Post-op Vital Signs: Reviewed and stable  Complications: No apparent anesthesia complications

## 2011-06-07 NOTE — Preoperative (Signed)
Beta Blockers   Reason not to administer Beta Blockers:Not Applicable 

## 2011-06-07 NOTE — Evaluation (Signed)
Physical Therapy Evaluation Patient Details Name: Meagan Osborne MRN: [id number] DOB: September 05, 1945 Today's Date: 06/07/2011  Problem List: There is no problem list on file for this patient.   Past Medical History:  Past Medical History  Diagnosis Date  . PONV (postoperative nausea and vomiting)   . Difficult intubation     pt states that anesthesia told her that she has a crooked airway  . Hyperlipidemia     takes Lipitor daily  . Hypertension     takes Benazepril daily and Carvedilol daily  . Pneumonia     in Dec 2012  . Hx of migraines     in early 20's  . Arthritis   . Joint pain   . Osteoporosis   . Chronic back pain   . Psoriasis   . GERD (gastroesophageal reflux disease)     takes Nexium daily  . H/O hiatal hernia   . Diverticulosis   . Urinary frequency   . Nocturia   . Bleeds easily     pt is on Plavix  . Bruises easily     takes Plavix   . Bilateral cataracts   . Coronary artery disease     stent X 1 in '08, stent X 1 '09   Past Surgical History:  Past Surgical History  Procedure Date  . Tonsillectomy     as a child  . Adenoidectomy     as a child  . Cholecystectomy 1990  . Abdominal hysterectomy 1976  . Hand surgery     right hand  . Carotid artery surgery 2011  . Colonoscopy   . Cardiac catheterization     PT Assessment/Plan/Recommendation PT Assessment Clinical Impression Statement: 66 year old s/p Lumbar 2-4 Decompressive Lumbar Laminectomy. Pt mobilizing well however recommend RW initially secondary to one balance loss and one episode of knee weakness with gait during which pt needed RW to prevent falling. Suspect current impairments highly correlated to mediation, pt declining HHPT at this time. Will follow pt acutely to work on impairments listed below.  PT Recommendation/Assessment: Patient will need skilled PT in the acute care venue PT Problem List: Decreased strength;Decreased activity tolerance;Decreased balance;Decreased  mobility;Decreased knowledge of use of DME;Pain Barriers to Discharge: None PT Therapy Diagnosis : Difficulty walking;Abnormality of gait;Generalized weakness;Acute pain PT Plan PT Frequency: Min 6X/week PT Treatment/Interventions: DME instruction;Gait training;Stair training;Functional mobility training;Therapeutic activities;Therapeutic exercise;Balance training;Patient/family education PT Recommendation Follow Up Recommendations: Supervision/Assistance - 24 hour Equipment Recommended: Rolling walker with 5" wheels PT Goals  Acute Rehab PT Goals PT Goal Formulation: With patient Time For Goal Achievement: 7 days Pt will Roll Supine to Right Side: with modified independence PT Goal: Rolling Supine to Right Side - Progress: Not met Pt will go Supine/Side to Sit: with modified independence PT Goal: Supine/Side to Sit - Progress: Not met Pt will go Sit to Supine/Side: with modified independence PT Goal: Sit to Supine/Side - Progress: Not met Pt will go Sit to Stand: with modified independence PT Goal: Sit to Stand - Progress: Not met Pt will go Stand to Sit: with modified independence PT Goal: Stand to Sit - Progress: Not met Pt will Ambulate: 51 - 150 feet;with modified independence PT Goal: Ambulate - Progress: Not met Pt will Go Up / Down Stairs: 3-5 stairs;with supervision;with least restrictive assistive device PT Goal: Up/Down Stairs - Progress: Not met Pt will Perform Home Exercise Program: Independently PT Goal: Perform Home Exercise Program - Progress: Not met  PT Evaluation Precautions/Restrictions  Precautions Precautions:  Fall;Back Precaution Booklet Issued: Yes (comment) Precaution Comments: Given back handout Required Braces or Orthoses: No Restrictions Weight Bearing Restrictions: No Prior Functioning  Home Living Lives With: Spouse Receives Help From: Family Type of Home: House Home Layout: One level Home Access: Stairs to enter Entrance Stairs-Rails:  Doctor, general practice of Steps: 5 Bathroom Shower/Tub: Tub/shower unit;Walk-in shower Bathroom Toilet: Handicapped height (both heights) Home Adaptive Equipment: Built-in shower seat Prior Function Level of Independence: Independent with basic ADLs Cognition Cognition Arousal/Alertness: Lethargic Overall Cognitive Status: Appears within functional limits for tasks assessed Orientation Level: Oriented to person;Oriented to place;Oriented to situation Sensation/Coordination Sensation Light Touch: Appears Intact Coordination Gross Motor Movements are Fluid and Coordinated: Yes Extremity Assessment RLE Assessment RLE Assessment: Within Functional Limits LLE Assessment LLE Assessment: Within Functional Limits Mobility (including Balance) Bed Mobility Bed Mobility: Yes Rolling Right: 4: Min assist;With rail Rolling Right Details (indicate cue type and reason): Tactile and verbal cues for sequencing and for log rolling.  Right Sidelying to Sit: 4: Min assist;HOB flat Right Sidelying to Sit Details (indicate cue type and reason): Assist to upright trunk, advance bil. LEs over EOB.  Sitting - Scoot to Edge of Bed: 4: Min assist Sitting - Scoot to [place] of Bed Details (indicate cue type and reason): Assist secondary to generalized weakness. Verbal cues to maintain precuations.  Sit to Sidelying Right: 4: Min assist;HOB flat Sit to Sidelying Right Details (indicate cue type and reason): Verbal cues for sequencing, assist for LEs only. Verbal cues for precautions.  Transfers Transfers: Yes Sit to Stand: Other (comment);From bed;From toilet (min-guard assist) Sit to Stand Details (indicate cue type and reason): (A) for safety, pt initally unsteady in standing. Verbal cues to gain balance prior to ambulation.  Stand to Sit: 5: Supervision;To toilet;To bed Stand to Sit Details: Verbal cues for UE placement, maintaining back precautions.  Ambulation/Gait Ambulation/Gait:  Yes Ambulation/Gait Assistance: 4: Min assist Ambulation/Gait Assistance Details (indicate cue type and reason): (A) for one loss of balance laterally without RW, once when pt with slight leg buckling secondary to an episode of back pain that resolved quickly. Pt advised to use RW initially for extra support to decrease of falling with return home.  Ambulation Distance (Feet): 80 Feet (15', then 65') Assistive device: Rolling walker;None Gait Pattern: Step-to pattern;Decreased stride length Gait velocity: Very slow cadence  Posture/Postural Control Posture/Postural Control: No significant limitations Balance Balance Assessed: Yes Static Sitting Balance Static Sitting - Balance Support: No upper extremity supported;Feet supported Static Sitting - Level of Assistance: 5: Stand by assistance Static Standing Balance Static Standing - Balance Support: No upper extremity supported Static Standing - Level of Assistance: 5: Stand by assistance Static Standing - Comment/# of Minutes: Stand by assist for safety Exercise  General Exercises - Lower Extremity Ankle Circles/Pumps: AROM;Both;10 reps;Supine End of Session PT - End of Session Equipment Utilized During Treatment: Gait belt Activity Tolerance: Patient tolerated treatment well Patient left: in bed;with call bell in reach;with family/visitor present Nurse Communication: Mobility status for ambulation General Behavior During Session: [facility] for tasks performed Cognition: [facility] for tasks performed  Sherie Don 06/07/2011, 3:30 PM  Sherie Don) Carleene Mains PT, DPT Acute Rehabilitation [phone]

## 2011-06-08 ENCOUNTER — Encounter (HOSPITAL_COMMUNITY): Payer: Self-pay | Admitting: Neurosurgery

## 2011-06-08 LAB — CBC
HCT: 32.7 % — ABNORMAL LOW (ref 36.0–46.0)
Hemoglobin: 10.5 g/dL — ABNORMAL LOW (ref 12.0–15.0)
MCH: 31.3 pg (ref 26.0–34.0)
MCHC: 32.1 g/dL (ref 30.0–36.0)
RDW: 13.4 % (ref 11.5–15.5)

## 2011-06-08 MED ORDER — SODIUM CHLORIDE 0.9 % IV BOLUS (SEPSIS)
500.0000 mL | Freq: Once | INTRAVENOUS | Status: AC
  Administered 2011-06-08: 500 mL via INTRAVENOUS

## 2011-06-08 MED ORDER — SODIUM CHLORIDE 0.9 % IV BOLUS (SEPSIS): 500.0000 mL | Freq: Once | INTRAVENOUS | Status: DC

## 2011-06-08 NOTE — Progress Notes (Signed)
Physical Therapy Treatment Patient Details Name: Meagan Osborne MRN: [id number] DOB: 03-01-1946 Today's Date: 06/08/2011  PT Assessment/Plan  PT - Assessment/Plan Comments on Treatment Session: 66 year old s/p Lumbar 2-4 Decompressive Lumbar Laminectomy. Pt continues to require RW with ambulation for safety. Pt's BP = 95/49 with MAP of 61 at start of treatment, consulted with RN who stated to proceed with caution and stop if pt became dizzy/lightheaded. Stairs not performed as pt slightly symptomatic with ambulation. Verbally reveiwed safe performance of stairs with pt. Pt has been refusing HHPT however would likely benefit this service.  PT Plan: Discharge plan needs to be updated PT Frequency: Min 6X/week Follow Up Recommendations: Supervision/Assistance - 24 hour;Home health PT Equipment Recommended: Rolling walker with 5" wheels PT Goals  Acute Rehab PT Goals Pt will go Sit to Stand: with modified independence PT Goal: Sit to Stand - Progress: Progressing toward goal Pt will go Stand to Sit: with modified independence PT Goal: Stand to Sit - Progress: Progressing toward goal Pt will Ambulate: 51 - 150 feet;with modified independence PT Goal: Ambulate - Progress: Progressing toward goal Pt will Go Up / Down Stairs: 3-5 stairs;with supervision;with least restrictive assistive device PT Goal: Up/Down Stairs - Progress: Not progressing Pt will Perform Home Exercise Program: Independently PT Goal: Perform Home Exercise Program - Progress: Progressing toward goal  PT Treatment Precautions/Restrictions  Precautions Precautions: Fall;Back Precaution Booklet Issued: Yes (comment) Precaution Comments: Pt able to recall 2/3 back precautions without cueing, verbally reviewed 3/3 precautions. Pt violating precautions during session reaching behind self to get bag, etc. Educated pt on precautions during functional activity.  Required Braces or Orthoses: No Restrictions Weight Bearing  Restrictions: No Mobility (including Balance) Bed Mobility Bed Mobility: No (seated at start) Sitting - Scoot to Edge of Bed: 5: Supervision Sitting - Scoot to Edge of Bed Details (indicate cue type and reason): Verbal cues for minimizing back rotation with scooting forwards.  Transfers Transfers: Yes Sit to Stand: From bed;5: Supervision Sit to Stand Details (indicate cue type and reason): Verbal cues for safe UE placement on support surface. Verbal cues on gaining balance in standing prior to ambulation. Performed 2x.  Stand to Sit: 5: Supervision;To bed Stand to Sit Details: Verbal cues for safe UE placement on support surface.  Ambulation/Gait Ambulation/Gait: Yes Ambulation/Gait Assistance: Other (comment) (min-guard assist) Ambulation/Gait Assistance Details (indicate cue type and reason): Min-guard assist with RW, Min assist without RW secondary to instability. No overt losses of balance today, ambulation limited secondary to low BP. Verbal cues for upright posture.  Ambulation Distance (Feet): 75 Feet Assistive device: Rolling walker;None Gait Pattern: Decreased stride length;Step-through pattern;Trunk flexed (Decreased foot clearance bilaterally. ) Gait velocity: Very slow cadence Stairs: No (not performed secondary to decreased BP.)  Posture/Postural Control Posture/Postural Control: No significant limitations Balance Balance Assessed: Yes Static Sitting Balance Static Sitting - Balance Support: No upper extremity supported;Feet supported Static Sitting - Level of Assistance: 6: Modified independent (Device/Increase time) Static Standing Balance Static Standing - Balance Support: No upper extremity supported Static Standing - Level of Assistance: 5: Stand by assistance Static Standing - Comment/# of Minutes: Pt with mild increased sway in standing without assistive device.  Exercise  Other Exercises Other Exercises: Lower abdominal contraction with gait for stabilization  of spine.  End of Session PT - End of Session Equipment Utilized During Treatment: Gait belt Activity Tolerance: Treatment limited secondary to medical complications (Comment) (low BP) Patient left: in bed;with call bell in reach (sitting EOB  for breakfast) Nurse Communication: Mobility status for ambulation (low BP) General Behavior During Session: [facility] for tasks performed Cognition: [facility] for tasks performed  Sherie Don 06/08/2011, 8:51 AM  Sherie Don) Carleene Mains PT, DPT Acute Rehabilitation [phone]

## 2011-06-08 NOTE — Progress Notes (Signed)
UR COMPLETED  

## 2011-06-08 NOTE — Progress Notes (Addendum)
Pt with a fever of 101.8. Pt has not been mobilizing d/t complaints of pain - has been in bed sleeping most of the time since before the beginning of this shift, only got up once to go to the bathroom. Pt had a cover and 2 blankets on in bed.  Percocet 2 tabs were given few minutes before fever was discovered, 1 blanket removed leaving 1, and incentive spirometer was encouraged. Will monitor and notify MD in am.  Addendum: Temp at this time 99.5 - two hours after begining of interventions for the above fever.

## 2011-06-08 NOTE — Progress Notes (Signed)
Late Note  Pt was hypotensive this am with BP of  75/35 P 57.  BP meds held.  MD made aware and pt given bolus of NS, placed in trendelenburg's, and placed on 2L 02 via [place].  Pt c/o of being light-headed and sleepy.  Pt was alert and oriented X 3, could move all extremities and follow commands. Bolus completed and IV fluids left going at 152ml/hr.  BP retaken 105/53 P 67.  Pt was still c/o feeing sleepy.  Pt taken out of trendelenburg's and MD notified.  Given instructions to continue holding BP meds and order for CBC.  Will continue to monitor.

## 2011-06-08 NOTE — Progress Notes (Signed)
Occupational Therapy Evaluation Patient Details Name: GENESYS COGGESHALL MRN: [id number] DOB: [birth year] Today's Date: 06/08/2011  Problem List: There is no problem list on file for this patient.   Past Medical History:  Past Medical History  Diagnosis Date  . PONV (postoperative nausea and vomiting)   . Difficult intubation     pt states that anesthesia told her that she has a crooked airway  . Hyperlipidemia     takes Lipitor daily  . Hypertension     takes Benazepril daily and Carvedilol daily  . Pneumonia     in Dec 2012  . Hx of migraines     in early 20's  . Arthritis   . Joint pain   . Osteoporosis   . Chronic back pain   . Psoriasis   . GERD (gastroesophageal reflux disease)     takes Nexium daily  . H/O hiatal hernia   . Diverticulosis   . Urinary frequency   . Nocturia   . Bleeds easily     pt is on Plavix  . Bruises easily     takes Plavix   . Bilateral cataracts   . Coronary artery disease     stent X 1 in '08, stent X 1 '09   Past Surgical History:  Past Surgical History  Procedure Date  . Tonsillectomy     as a child  . Adenoidectomy     as a child  . Cholecystectomy 1990  . Abdominal hysterectomy 1976  . Hand surgery     right hand  . Carotid artery surgery 2011  . Colonoscopy   . Cardiac catheterization     OT Assessment/Plan/Recommendation OT Assessment Clinical Impression Statement: Pt s/p Lumbar 2-4 Decompressive Lumbar Laminectomy and demonstrates with decreased I with ADLs and functional transfers.  Pt with low BP during PT session earlier in AM (RN made aware).  Pt educated on use of AE during ADLs to maintain back safety precautions.  Pt will benefit from acute OT to increase safety with functional transfers and to increase I with ADLs in prep for d/c home.  OT Recommendation/Assessment: Patient will need skilled OT in the acute care venue OT Problem List: Decreased activity tolerance;Decreased knowledge of precautions;Decreased knowledge  of use of DME or AE;Pain OT Therapy Diagnosis : Acute pain OT Plan OT Frequency: Min 2X/week OT Treatment/Interventions: Self-care/ADL training;DME and/or AE instruction;Therapeutic activities;Patient/family education OT Recommendation Follow Up Recommendations: Supervision/Assistance - 24 hour Equipment Recommended: Rolling walker with 5" wheels Individuals Consulted Consulted and Agree with Results and Recommendations: Patient OT Goals Acute Rehab OT Goals OT Goal Formulation: With patient Time For Goal Achievement: 7 days ADL Goals Pt Will Perform Lower Body Dressing: with modified independence;with adaptive equipment;Sit to stand from chair;Sit to stand from bed ADL Goal: Lower Body Dressing - Progress: Goal set today Pt Will Transfer to Toilet: with modified independence;Ambulation;Comfort height toilet;Maintaining back safety precautions ADL Goal: Toilet Transfer - Progress: Goal set today Pt Will Perform Tub/Shower Transfer: Shower transfer;with modified independence;Ambulation;with DME;Shower seat with back ADL Goal: Web designer - Progress: Goal set today  OT Evaluation Precautions/Restrictions  Precautions Precautions: Fall;Back Precaution Booklet Issued: Yes (comment) Precaution Comments: Pt able to recall 3/3 back precautions Required Braces or Orthoses: No Restrictions Weight Bearing Restrictions: No Prior Functioning Home Living Lives With: Spouse Receives Help From: Family Type of Home: House Home Layout: One level Home Access: Stairs to enter Entrance Stairs-Rails: Doctor, general practice of Steps: 5 Bathroom Shower/Tub: Tub/shower unit;Walk-in shower Bathroom Toilet:  Handicapped height Home Adaptive Equipment: Built-in shower seat;Reacher Prior Function Level of Independence: Independent with basic ADLs ADL ADL Grooming: Simulated;Supervision/safety Grooming Details (indicate cue type and reason): Supervision for maintaining back  precautions Where Assessed - Grooming: Standing at sink Lower Body Bathing: Simulated;Minimal assistance Lower Body Bathing Details (indicate cue type and reason): Pt with difficulty reaching bil. lower legs and feet due to discomfort when crossing legs. Where Assessed - Lower Body Bathing: Sitting, bed Lower Body Dressing: Simulated;Minimal assistance Lower Body Dressing Details (indicate cue type and reason): Pt with difficulty reaching bil. lower legs and feet due to discomfort when crossing legs. Where Assessed - Lower Body Dressing: Sitting, bed Toilet Transfer: Simulated;Minimal assistance;Other (comment) (min guard assist) Toilet Transfer Method: Stand pivot ADL Comments: Educated pt on use of reacher for performing LB ADL tasks, Vision/Perception    Cognition Cognition Arousal/Alertness: Awake/alert Overall Cognitive Status: Appears within functional limits for tasks assessed Orientation Level: Oriented X4 Sensation/Coordination   Extremity Assessment RUE Assessment RUE Assessment: Within Functional Limits LUE Assessment LUE Assessment: Within Functional Limits Mobility  Bed Mobility Bed Mobility: No (seated at start) Sitting - Scoot to Edge of Bed: 5: Supervision Sitting - Scoot to [place] of Bed Details (indicate cue type and reason): Verbal cues for minimizing back rotation with scooting forwards.  Sit to Sidelying Right: 5: Supervision;With rail;HOB flat Sit to Sidelying Right Details (indicate cue type and reason): VC for maintaining back precautions Transfers Sit to Stand: From bed;5: Supervision Sit to Stand Details (indicate cue type and reason): Verbal cues for safe UE placement on support surface. Verbal cues on gaining balance in standing prior to ambulation. Performed 2x.  Stand to Sit: 5: Supervision;To bed Stand to Sit Details: Verbal cues for safe UE placement on support surface.  Exercises  End of Session OT - End of Session Activity Tolerance: Patient  limited by fatigue;Patient limited by pain Patient left: in bed;with call bell in reach General Behavior During Session: [facility] for tasks performed Cognition: [facility] for tasks performed   Cipriano Mile 06/08/2011, 11:16 AM  06/08/2011 Cipriano Mile OTR/L Pager [phone] Office [phone]

## 2011-06-08 NOTE — Progress Notes (Signed)
Subjective: Patient reports Condition of back pain but leg feels much better and stronger. She did have an episode of hypotension last night as well as a low-grade fever  Objective: Vital signs in last 24 hours: Temp:  [97.3 F (36.3 C)-101.8 F (38.8 C)] 98.7 F (37.1 C) (01/29 0800) Pulse Rate:  [51-78] 64  (01/29 0800) Resp:  [9-21] 16  (01/29 0800) BP: (84-134)/(45-76) 95/49 mmHg (01/29 0820) SpO2:  [92 %-100 %] 96 % (01/29 0820) FiO2 (%):  [2 %] 2 % (01/28 1110) Weight:  [65.9 kg (145 lb 4.5 oz)] 65.9 kg (145 lb 4.5 oz) (01/28 1100)  Intake/Output from previous day: 01/28 0701 - 01/29 0700 In: 1700 [P.O.:400; I.V.:1300] Out: 185 [Drains:85; Blood:100] Intake/Output this shift:    Strength out of 5 in left lower extremity improved from preop wound clean and dry drain output is minimal  Lab Results: No results found for this basename: WBC:2,HGB:2,HCT:2,PLT:2 in the last 72 hours BMET No results found for this basename: NA:2,K:2,CL:2,CO2:2,GLUCOSE:2,BUN:2,CREATININE:2,CALCIUM:2 in the last 72 hours  Studies/Results: Dg Lumbar Spine 2-3 Views  06/07/2011  *RADIOLOGY REPORT*  Clinical Data: Degenerative disc disease.  LUMBAR SPINE - 2-3 VIEW  Comparison: Radiographs dated 12/30/2010  Findings: If the last open interspace is L5-S1, on image #1 there is a needle adjacent to the space between the spinous processes of L3 and L4.  On image #2 using the same numbering sequence, there is an instrument at the level of the lamina of L3.  On the image #3 there is an instrument at the level of the lamina of L4.  IMPRESSION:  Instruments in position as described.  Original Report Authenticated By: Gwynn Burly, M.D.    Assessment/Plan: Progressive mobilization today fluid bolus her blood pressure to incentive spirometry for a low-grade fever continued observation possible discharge later this afternoon  LOS: 1 day     Guiliana Shor P 06/08/2011, 9:06 AM

## 2011-06-09 MED ORDER — HYDROMORPHONE HCL 2 MG PO TABS: 2.0000 mg | ORAL_TABLET | ORAL | Status: AC | PRN

## 2011-06-09 MED ORDER — CYCLOBENZAPRINE HCL 10 MG PO TABS: 10.0000 mg | ORAL_TABLET | Freq: Three times a day (TID) | ORAL | Status: AC | PRN

## 2011-06-09 NOTE — Progress Notes (Signed)
Night Shift Nursing Note. SBP 103 -133 DBP 40 - 43 Temp 98.6 - 99.1 Pulse 65 -78 Resp 18 02 sats 95 -97% on nasal cannula 02. Pt more awake, alert and mobilizing much more better than the previous day. Incentive spirometer in use. IVF NS on-going at 145ml/hr. Pain manageable. No acute distress/episode noted.

## 2011-06-09 NOTE — Progress Notes (Signed)
Pt refused rolling walker for home use.

## 2011-06-09 NOTE — Discharge Summary (Signed)
  Physician Discharge Summary  Patient ID: Meagan Osborne MRN: [id number] DOB/AGE: 67-Sep-1947 67 y.o.  Admit date: 06/07/2011 Discharge date: 06/09/2011  Admission Diagnoses: Lumbar spinal stenosis L2-3 L3-4  Discharge Diagnoses: Same Active Problems:  * No active hospital problems. *    Discharged Condition: good  Hospital Course: Patient was admitted hospital underwent a two-level decompressive laminectomy postoperatively patient did very well with recovered in the floor. Postoperative day 1 patient was angling however with significant pain she was having difficulty with hypotension acquiring fluid boluses labs are checked she was mildly anemic throughout the day she did improve with her blood pressure occasional spike low-grade temperatures this is controlled incentive respirometer he patient is a be discharged home on postop day 2 was scheduled followup approximately one week.  Consults:  Significant Diagnostic Studies: Treatments: L2-3 L3-4 decompressive laminectomy Discharge Exam: Blood pressure 143/65, pulse 76, temperature 98.4 F (36.9 C), temperature source Oral, resp. rate 20, weight 65.9 kg (145 lb 4.5 oz), SpO2 94.00%. Strength 5 out of 5 wound clean and dry  Disposition: Home   Medication List  As of 06/09/2011  8:22 AM   TAKE these medications         aspirin EC 81 MG tablet   Take 81 mg by mouth daily.      atorvastatin 40 MG tablet   Commonly known as: LIPITOR   Take 40 mg by mouth daily.      benazepril 40 MG tablet   Commonly known as: LOTENSIN   Take 40 mg by mouth daily.      carvedilol 25 MG tablet   Commonly known as: COREG   Take 25 mg by mouth 2 (two) times daily with a meal.      clopidogrel 75 MG tablet   Commonly known as: PLAVIX   Take 75 mg by mouth daily.      cyclobenzaprine 10 MG tablet   Commonly known as: FLEXERIL   Take 1 tablet (10 mg total) by mouth 3 (three) times daily as needed for muscle spasms.      esomeprazole 40 MG  capsule   Commonly known as: NEXIUM   Take 40 mg by mouth daily before breakfast.      HYDROmorphone 2 MG tablet   Commonly known as: DILAUDID   Take 1 tablet (2 mg total) by mouth every 4 (four) hours as needed for pain.      multivitamins ther. w/minerals Tabs   Take 1 tablet by mouth daily.      nitroGLYCERIN 0.4 MG SL tablet   Commonly known as: NITROSTAT   Place 0.4 mg under the tongue every 5 (five) minutes as needed. For chest pain           Follow-up Information    Follow up in 1 week.         Signed: Darneshia Demary P 06/09/2011, 8:22 AM

## 2011-06-09 NOTE — Progress Notes (Signed)
Subjective: Patient reports Patient much better this more pain is better controlled no leg pain on about  Objective: Vital signs in last 24 hours: Temp:  [98.4 F (36.9 C)-101.1 F (38.4 C)] 98.4 F (36.9 C) (01/30 0800) Pulse Rate:  [57-85] 76  (01/30 0800) Resp:  [16-20] 20  (01/30 0800) BP: (75-143)/(35-65) 143/65 mmHg (01/30 0800) SpO2:  [94 %-97 %] 94 % (01/30 0800)  Intake/Output from previous day: 01/29 0701 - 01/30 0700 In: 720 [P.O.:720] Out: 25 [Drains:25] Intake/Output this shift:    Strength 5 out of 5 wound clean and dry DC her Hemovac  Lab Results:  Basename 06/08/11 1055  WBC 8.2  HGB 10.5*  HCT 32.7*  PLT 166   BMET No results found for this basename: NA:2,K:2,CL:2,CO2:2,GLUCOSE:2,BUN:2,CREATININE:2,CALCIUM:2 in the last 72 hours  Studies/Results: Dg Lumbar Spine 2-3 Views  06/07/2011  *RADIOLOGY REPORT*  Clinical Data: Degenerative disc disease.  LUMBAR SPINE - 2-3 VIEW  Comparison: Radiographs dated 12/30/2010  Findings: If the last open interspace is L5-S1, on image #1 there is a needle adjacent to the space between the spinous processes of L3 and L4.  On image #2 using the same numbering sequence, there is an instrument at the level of the lamina of L3.  On the image #3 there is an instrument at the level of the lamina of L4.  IMPRESSION:  Instruments in position as described.  Original Report Authenticated By: Gwynn Burly, M.D.    Assessment/Plan: 66 year old female presented to from a two-level laminectomy doing much better we'll plan discharged later on this morning.  LOS: 2 days     Meagan Osborne P 06/09/2011, 8:16 AM

## 2011-10-21 ENCOUNTER — Ambulatory Visit: Payer: Self-pay | Admitting: Family Medicine

## 2011-12-14 ENCOUNTER — Emergency Department: Payer: Self-pay | Admitting: Emergency Medicine

## 2012-05-10 HISTORY — PX: BELPHAROPTOSIS REPAIR: SHX369

## 2012-06-13 ENCOUNTER — Ambulatory Visit: Payer: Self-pay | Admitting: Family Medicine

## 2012-09-12 ENCOUNTER — Ambulatory Visit: Payer: Medicare Other | Admitting: Internal Medicine

## 2013-01-27 IMAGING — US TRANSABDOMINAL ULTRASOUND OF PELVIS
1 series · 14 of 25 positions shown · non-contrast
Comparison: none

REASON FOR EXAM: STAT READ  RLQ low abd and pelvic pain
COMMENTS:

[Series 1: transabdominal ultrasound of pelvis · 0.30mm/px · 14 of 51 slices shown]
[im 1/51]
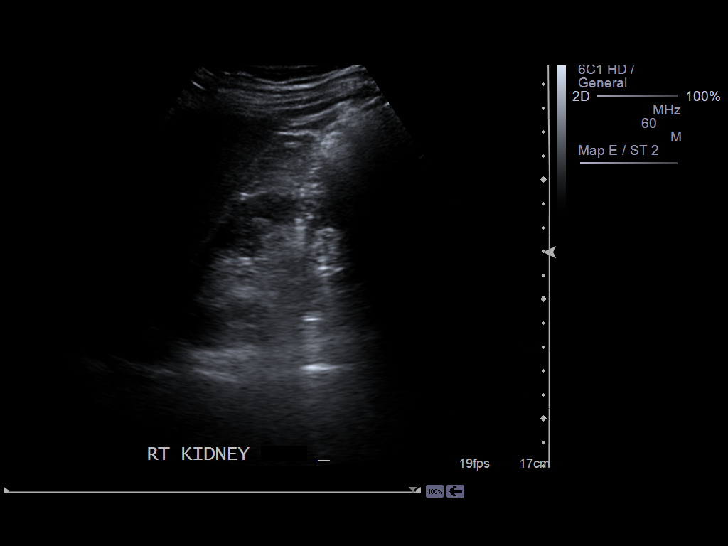
[im 5/51]
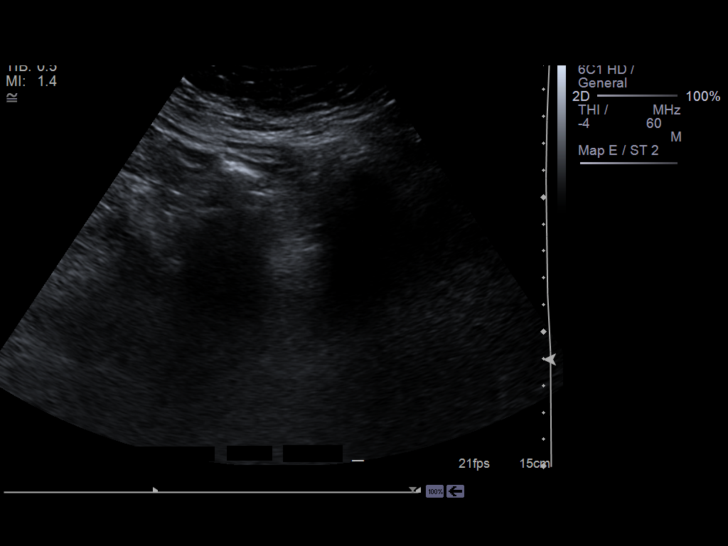
[im 9/51]
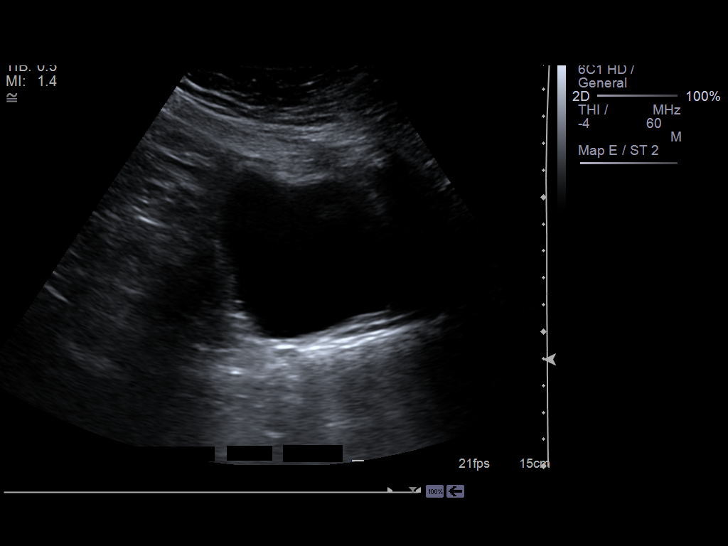
[im 13/51]
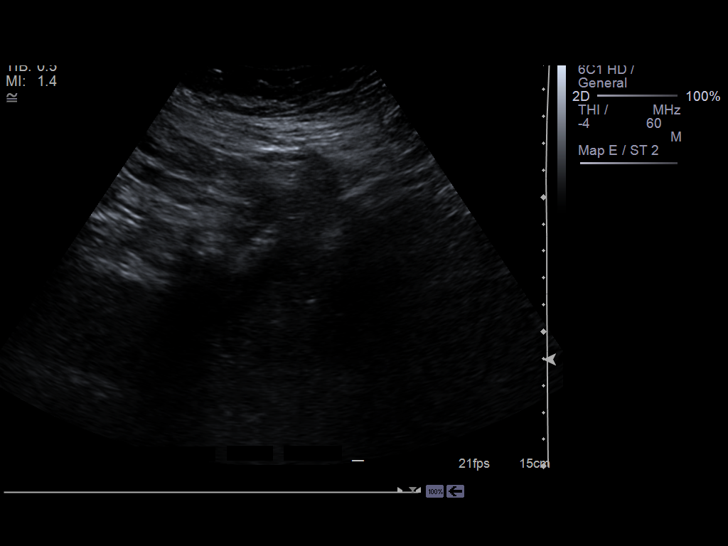
[im 17/51]
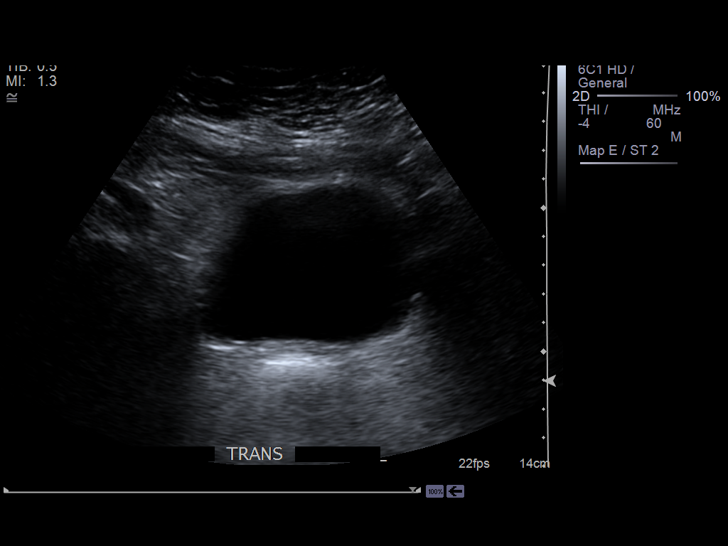
[im 19/51]
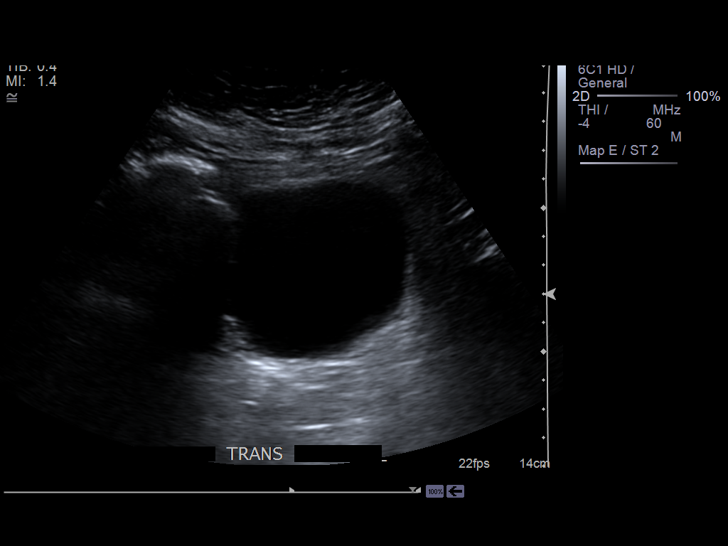
[im 23/51]
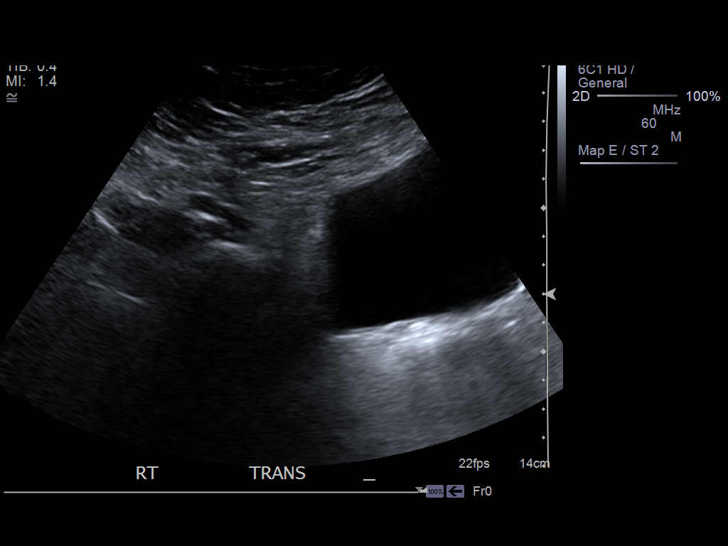
[im 28/51]
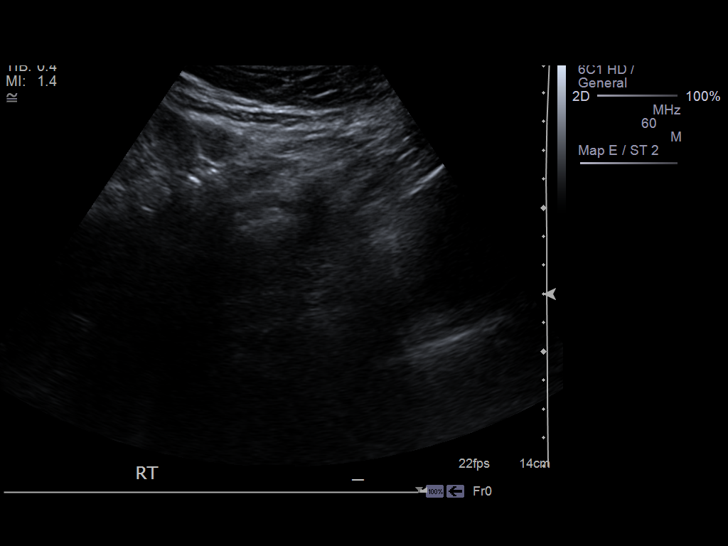
[im 32/51]
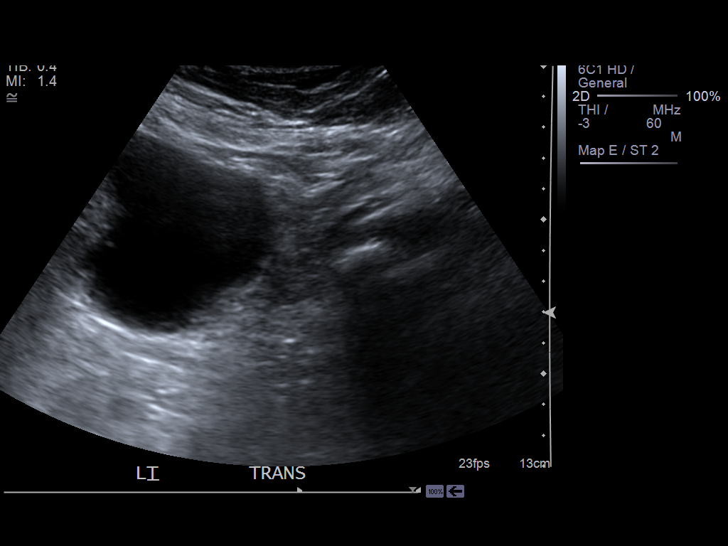
[im 34/51]
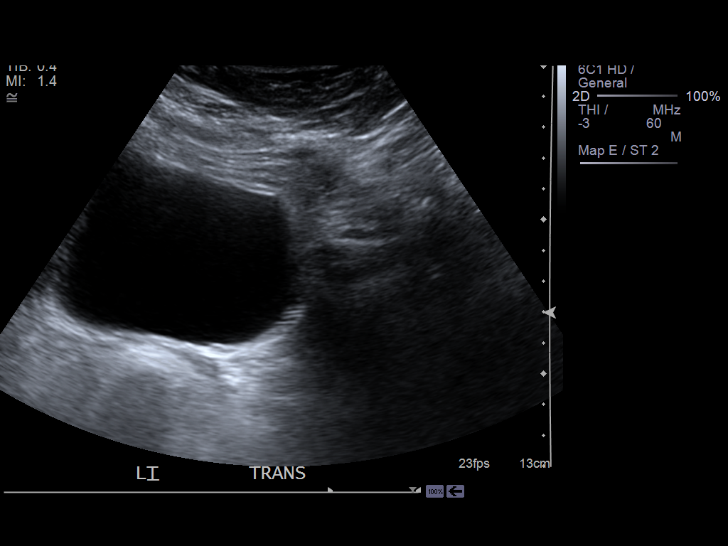
[im 38/51]
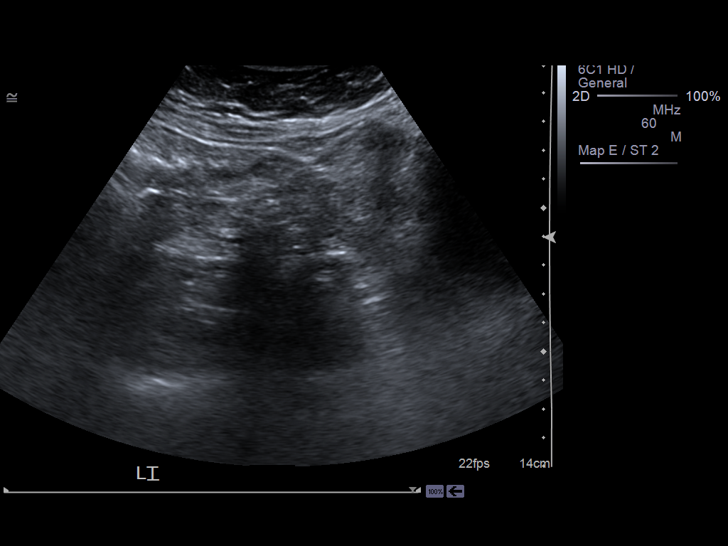
[im 42/51]
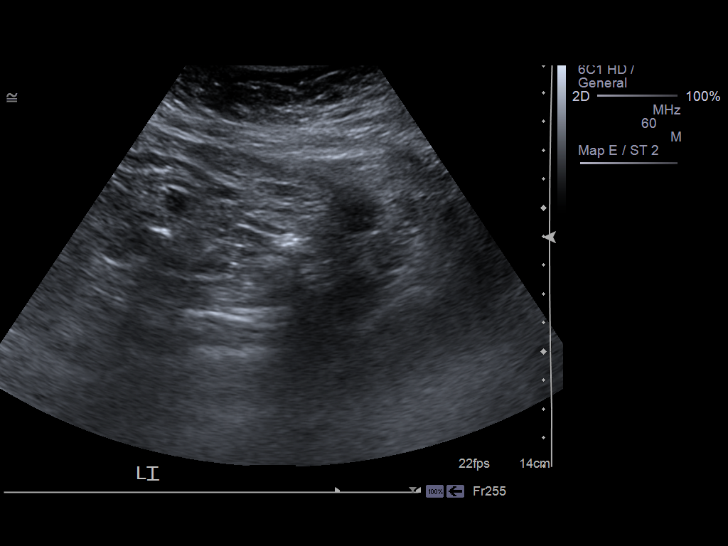
[im 46/51]
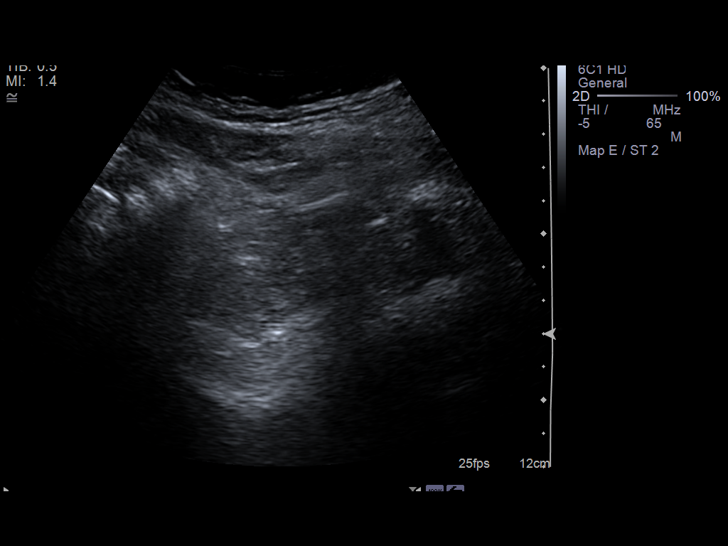
[im 51/51]
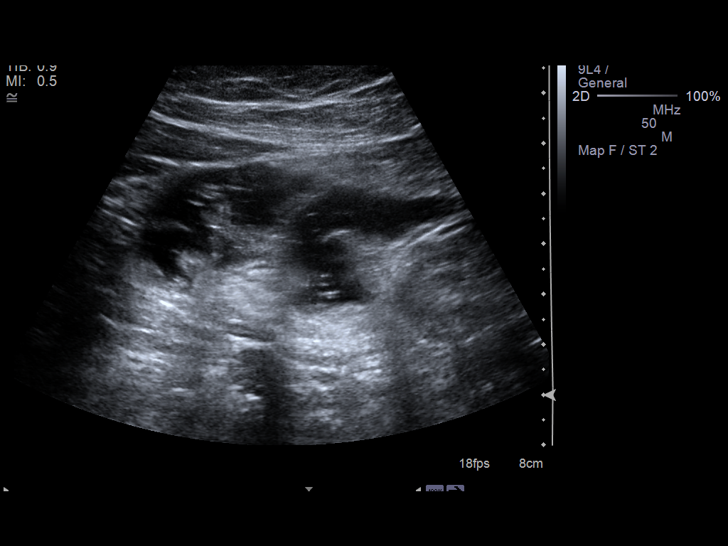

[14 of 25 positions shown; findings below may reference images not displayed]

PROCEDURE:     SINCERE - SINCERE PELVIS NON-OB  - October 21, 2011 [DATE]

RESULT:     The patient has a history of hysterectomy. Transabdominal only
imaging was performed. The ovaries are not seen. The kidneys appear grossly
normal on survey images. There is urine in the bladder. There is no ascites
or abnormal fluid collection seen within the pelvic region. The patient
refused endovaginal imaging.
IMPRESSION: 1. Status post hysterectomy. The uterus could not be visualized. No free
fluid or abnormal fluid collection evident. No definite mass demonstrated.

[REDACTED]

## 2013-05-10 HISTORY — PX: CATARACT EXTRACTION W/ INTRAOCULAR LENS  IMPLANT, BILATERAL: SHX1307

## 2013-07-08 HISTORY — PX: EYE SURGERY: SHX253

## 2014-01-25 ENCOUNTER — Other Ambulatory Visit: Payer: Self-pay | Admitting: Urology

## 2014-01-30 ENCOUNTER — Encounter (HOSPITAL_BASED_OUTPATIENT_CLINIC_OR_DEPARTMENT_OTHER): Payer: Self-pay | Admitting: *Deleted

## 2014-01-31 ENCOUNTER — Encounter (HOSPITAL_BASED_OUTPATIENT_CLINIC_OR_DEPARTMENT_OTHER): Payer: Self-pay | Admitting: *Deleted

## 2014-01-31 NOTE — Progress Notes (Signed)
NPO AFTER MN. ARRIVE AT 8675. NEEDS ISTAT. CURRENT EKG, LOV NOTE TO BE FAXED FROM [facility].  WILL TAKE COREG AND NEXIUM AM DOS W/ SIPS OF WATER.

## 2014-02-04 NOTE — Anesthesia Preprocedure Evaluation (Addendum)
Anesthesia Evaluation  Patient identified by MRN, date of birth, ID band Patient awake    Reviewed: Allergy & Precautions, H&P , NPO status , Patient's Chart, lab work & pertinent test results, reviewed documented beta blocker date and time   History of Anesthesia Complications (+) PONV, DIFFICULT AIRWAY and history of anesthetic complications (Hx of having a "crooked" airway at [facility] and postoperative horseness/sore thoat. Intubated in 2013 at [facility] and was an easy mask, grade 1 view with glidescope and  ETT passed without difficulty.)  Airway Mallampati: II TM Distance: >3 FB Neck ROM: Limited  Mouth opening: Limited Mouth Opening  Dental no notable dental hx.    Pulmonary neg pulmonary ROS, former smoker,  breath sounds clear to auscultation  Pulmonary exam normal       Cardiovascular Exercise Tolerance: Good hypertension, Pt. on medications and Pt. on home beta blockers + CAD and + Cardiac Stents (stent in 08 and 09, no problems since, >4METS) Rhythm:Regular Rate:Normal     Neuro/Psych TIAnegative psych ROS   GI/Hepatic Neg liver ROS, GERD-  Medicated and Controlled,  Endo/Other  negative endocrine ROS  Renal/GU negative Renal ROS  negative genitourinary   Musculoskeletal  (+) Arthritis -, Osteoarthritis,    Abdominal   Peds negative pediatric ROS (+)  Hematology negative hematology ROS (+)   Anesthesia Other Findings   Reproductive/Obstetrics negative OB ROS                          Anesthesia Physical Anesthesia Plan  ASA: III  Anesthesia Plan: General   Post-op Pain Management:    Induction: Intravenous  Airway Management Planned: LMA  Additional Equipment:   Intra-op Plan:   Post-operative Plan: Extubation in OR  Informed Consent: I have reviewed the patients History and Physical, chart, labs and discussed the procedure including the risks, benefits and alternatives for the  proposed anesthesia with the patient or authorized representative who has indicated his/her understanding and acceptance.   Dental advisory given  Plan Discussed with: CRNA  Anesthesia Plan Comments:        Anesthesia Quick Evaluation

## 2014-02-05 ENCOUNTER — Encounter (HOSPITAL_BASED_OUTPATIENT_CLINIC_OR_DEPARTMENT_OTHER): Payer: Medicare Other | Admitting: Anesthesiology

## 2014-02-05 ENCOUNTER — Ambulatory Visit (HOSPITAL_BASED_OUTPATIENT_CLINIC_OR_DEPARTMENT_OTHER)
Admission: RE | Admit: 2014-02-05 | Discharge: 2014-02-05 | Disposition: A | Discharge status: Home / Self Care | Payer: Medicare Other | Source: Ambulatory Visit | Attending: Urology | Admitting: Urology

## 2014-02-05 ENCOUNTER — Encounter (HOSPITAL_BASED_OUTPATIENT_CLINIC_OR_DEPARTMENT_OTHER)
Admission: RE | Disposition: A | Discharge status: Home / Self Care | Payer: Self-pay | Source: Ambulatory Visit | Attending: Urology

## 2014-02-05 ENCOUNTER — Ambulatory Visit (HOSPITAL_BASED_OUTPATIENT_CLINIC_OR_DEPARTMENT_OTHER): Payer: Medicare Other | Admitting: Anesthesiology

## 2014-02-05 ENCOUNTER — Encounter (HOSPITAL_BASED_OUTPATIENT_CLINIC_OR_DEPARTMENT_OTHER): Payer: Self-pay | Admitting: *Deleted

## 2014-02-05 DIAGNOSIS — C679 Malignant neoplasm of bladder, unspecified: Secondary | ICD-10-CM | POA: Insufficient documentation

## 2014-02-05 DIAGNOSIS — K219 Gastro-esophageal reflux disease without esophagitis: Secondary | ICD-10-CM | POA: Diagnosis not present

## 2014-02-05 DIAGNOSIS — F411 Generalized anxiety disorder: Secondary | ICD-10-CM | POA: Insufficient documentation

## 2014-02-05 DIAGNOSIS — Z79899 Other long term (current) drug therapy: Secondary | ICD-10-CM | POA: Insufficient documentation

## 2014-02-05 DIAGNOSIS — D414 Neoplasm of uncertain behavior of bladder: Secondary | ICD-10-CM

## 2014-02-05 DIAGNOSIS — E7800 Pure hypercholesterolemia, unspecified: Secondary | ICD-10-CM | POA: Diagnosis not present

## 2014-02-05 DIAGNOSIS — M129 Arthropathy, unspecified: Secondary | ICD-10-CM | POA: Diagnosis not present

## 2014-02-05 DIAGNOSIS — I10 Essential (primary) hypertension: Secondary | ICD-10-CM | POA: Insufficient documentation

## 2014-02-05 DIAGNOSIS — Z7982 Long term (current) use of aspirin: Secondary | ICD-10-CM | POA: Diagnosis not present

## 2014-02-05 DIAGNOSIS — N3289 Other specified disorders of bladder: Secondary | ICD-10-CM | POA: Diagnosis present

## 2014-02-05 DIAGNOSIS — Z9089 Acquired absence of other organs: Secondary | ICD-10-CM | POA: Diagnosis not present

## 2014-02-05 HISTORY — DX: Spondylosis without myelopathy or radiculopathy, site unspecified: M47.819

## 2014-02-05 HISTORY — PX: TRANSURETHRAL RESECTION OF BLADDER TUMOR WITH MITOMYCIN-C: SHX6459

## 2014-02-05 HISTORY — DX: Neoplasm of unspecified behavior of bladder: D49.4

## 2014-02-05 LAB — POCT I-STAT 4, (NA,K, GLUC, HGB,HCT)
GLUCOSE: 135 mg/dL — AB (ref 70–99)
HCT: 41 % (ref 36.0–46.0)
HEMOGLOBIN: 13.9 g/dL (ref 12.0–15.0)
POTASSIUM: 4.3 meq/L (ref 3.7–5.3)
SODIUM: 141 meq/L (ref 137–147)

## 2014-02-05 SURGERY — TRANSURETHRAL RESECTION OF BLADDER TUMOR WITH MITOMYCIN-C
Anesthesia: General | Site: Bladder

## 2014-02-05 MED ORDER — BELLADONNA ALKALOIDS-OPIUM 16.2-60 MG RE SUPP
RECTAL | Status: DC | PRN
  Administered 2014-02-05: 1 via RECTAL

## 2014-02-05 MED ORDER — MEPERIDINE HCL 25 MG/ML IJ SOLN
6.2500 mg | INTRAMUSCULAR | Status: DC | PRN
  Filled 2014-02-05: qty 1

## 2014-02-05 MED ORDER — PROMETHAZINE HCL 25 MG/ML IJ SOLN
6.2500 mg | INTRAMUSCULAR | Status: DC | PRN
  Filled 2014-02-05: qty 1

## 2014-02-05 MED ORDER — CEFAZOLIN SODIUM 1-5 GM-% IV SOLN
1.0000 g | INTRAVENOUS | Status: DC
  Filled 2014-02-05: qty 50

## 2014-02-05 MED ORDER — FENTANYL CITRATE 0.05 MG/ML IJ SOLN
25.0000 ug | INTRAMUSCULAR | Status: DC | PRN
  Filled 2014-02-05: qty 1

## 2014-02-05 MED ORDER — DEXAMETHASONE SODIUM PHOSPHATE 4 MG/ML IJ SOLN
INTRAMUSCULAR | Status: DC | PRN
  Administered 2014-02-05: 8 mg via INTRAVENOUS

## 2014-02-05 MED ORDER — LIDOCAINE HCL (CARDIAC) 20 MG/ML IV SOLN
INTRAVENOUS | Status: DC | PRN
  Administered 2014-02-05: 60 mg via INTRAVENOUS

## 2014-02-05 MED ORDER — FENTANYL CITRATE 0.05 MG/ML IJ SOLN
INTRAMUSCULAR | Status: DC | PRN
  Administered 2014-02-05: 12.5 ug via INTRAVENOUS
  Administered 2014-02-05 (×2): 25 ug via INTRAVENOUS
  Administered 2014-02-05 (×3): 12.5 ug via INTRAVENOUS

## 2014-02-05 MED ORDER — GLYCOPYRROLATE 0.2 MG/ML IJ SOLN
INTRAMUSCULAR | Status: DC | PRN
  Administered 2014-02-05: 0.2 mg via INTRAVENOUS

## 2014-02-05 MED ORDER — KETOROLAC TROMETHAMINE 30 MG/ML IJ SOLN
INTRAMUSCULAR | Status: DC | PRN
  Administered 2014-02-05: 15 mg via INTRAVENOUS

## 2014-02-05 MED ORDER — FENTANYL CITRATE 0.05 MG/ML IJ SOLN
INTRAMUSCULAR | Status: AC
  Filled 2014-02-05: qty 6

## 2014-02-05 MED ORDER — SODIUM CHLORIDE 0.9 % IR SOLN
Status: DC | PRN
  Administered 2014-02-05: 6000 mL

## 2014-02-05 MED ORDER — PHENAZOPYRIDINE HCL 200 MG PO TABS: 200.0000 mg | ORAL_TABLET | Freq: Three times a day (TID) | ORAL | Status: DC | PRN

## 2014-02-05 MED ORDER — LACTATED RINGERS IV SOLN
INTRAVENOUS | Status: DC | PRN
  Administered 2014-02-05 (×2): via INTRAVENOUS

## 2014-02-05 MED ORDER — BELLADONNA ALKALOIDS-OPIUM 16.2-60 MG RE SUPP
RECTAL | Status: AC
  Filled 2014-02-05: qty 1

## 2014-02-05 MED ORDER — MIDAZOLAM HCL 2 MG/2ML IJ SOLN
INTRAMUSCULAR | Status: AC
  Filled 2014-02-05: qty 2

## 2014-02-05 MED ORDER — ONDANSETRON HCL 4 MG/2ML IJ SOLN
INTRAMUSCULAR | Status: DC | PRN
  Administered 2014-02-05: 4 mg via INTRAVENOUS

## 2014-02-05 MED ORDER — ACETAMINOPHEN 10 MG/ML IV SOLN
INTRAVENOUS | Status: DC | PRN
  Administered 2014-02-05: 1000 mg via INTRAVENOUS

## 2014-02-05 MED ORDER — PROPOFOL 10 MG/ML IV BOLUS
INTRAVENOUS | Status: DC | PRN
  Administered 2014-02-05: 170 mg via INTRAVENOUS

## 2014-02-05 MED ORDER — CEFAZOLIN SODIUM-DEXTROSE 2-3 GM-% IV SOLR
INTRAVENOUS | Status: AC
  Filled 2014-02-05: qty 50

## 2014-02-05 MED ORDER — CEFAZOLIN SODIUM-DEXTROSE 2-3 GM-% IV SOLR
2.0000 g | INTRAVENOUS | Status: AC
  Administered 2014-02-05: 2 g via INTRAVENOUS
  Filled 2014-02-05: qty 50

## 2014-02-05 MED ORDER — EPHEDRINE SULFATE 50 MG/ML IJ SOLN
INTRAMUSCULAR | Status: DC | PRN
  Administered 2014-02-05 (×2): 10 mg via INTRAVENOUS

## 2014-02-05 MED ORDER — MITOMYCIN CHEMO FOR BLADDER INSTILLATION 40 MG
40.0000 mg | Freq: Once | INTRAVENOUS | Status: AC
  Administered 2014-02-05: 40 mg via INTRAVESICAL
  Filled 2014-02-05: qty 40

## 2014-02-05 MED ORDER — PHENAZOPYRIDINE HCL 200 MG PO TABS
200.0000 mg | ORAL_TABLET | Freq: Once | ORAL | Status: AC
  Administered 2014-02-05: 200 mg via ORAL
  Filled 2014-02-05: qty 1

## 2014-02-05 MED ORDER — PHENAZOPYRIDINE HCL 100 MG PO TABS
ORAL_TABLET | ORAL | Status: AC
Start: 2014-02-05 — End: 2014-02-05
  Filled 2014-02-05: qty 2

## 2014-02-05 MED ORDER — LACTATED RINGERS IV SOLN
INTRAVENOUS | Status: DC
  Administered 2014-02-05: 09:00:00 via INTRAVENOUS
  Filled 2014-02-05: qty 1000

## 2014-02-05 SURGICAL SUPPLY — 33 items
BAG DRAIN URO-CYSTO SKYTR STRL (DRAIN) ×3 IMPLANT
BAG URINE DRAINAGE (UROLOGICAL SUPPLIES) ×3 IMPLANT
BAG URINE LEG 19OZ MD ST LTX (BAG) IMPLANT
CANISTER SUCT LVC 12 LTR MEDI- (MISCELLANEOUS) ×3 IMPLANT
CATH FOLEY 2WAY SLVR  5CC 16FR (CATHETERS) ×2
CATH FOLEY 2WAY SLVR  5CC 20FR (CATHETERS)
CATH FOLEY 2WAY SLVR  5CC 22FR (CATHETERS)
CATH FOLEY 2WAY SLVR 5CC 16FR (CATHETERS) ×1 IMPLANT
CATH FOLEY 2WAY SLVR 5CC 20FR (CATHETERS) IMPLANT
CATH FOLEY 2WAY SLVR 5CC 22FR (CATHETERS) IMPLANT
CLOTH BEACON ORANGE TIMEOUT ST (SAFETY) ×3 IMPLANT
DRAPE CAMERA CLOSED 9X96 (DRAPES) ×3 IMPLANT
DRESSING TELFA 8X3 (GAUZE/BANDAGES/DRESSINGS) IMPLANT
ELECT BUTTON BIOP 24F 90D PLAS (MISCELLANEOUS) IMPLANT
ELECT LOOP HF 26F 30D .35MM (CUTTING LOOP) IMPLANT
ELECT LOOP MED HF 24F 12D CBL (CLIP) ×3 IMPLANT
ELECT REM PT RETURN 9FT ADLT (ELECTROSURGICAL) ×3
ELECTRODE REM PT RTRN 9FT ADLT (ELECTROSURGICAL) ×1 IMPLANT
EVACUATOR MICROVAS BLADDER (UROLOGICAL SUPPLIES) IMPLANT
GLOVE BIO SURGEON STRL SZ7.5 (GLOVE) ×3 IMPLANT
GLOVE BIOGEL M 6.5 STRL (GLOVE) ×3 IMPLANT
GLOVE BIOGEL PI IND STRL 6.5 (GLOVE) ×1 IMPLANT
GLOVE BIOGEL PI INDICATOR 6.5 (GLOVE) ×2
GOWN STRL REIN XL XLG (GOWN DISPOSABLE) IMPLANT
GOWN STRL REUS W/TWL LRG LVL3 (GOWN DISPOSABLE) ×3 IMPLANT
GOWN STRL REUS W/TWL XL LVL3 (GOWN DISPOSABLE) ×3 IMPLANT
HOLDER FOLEY CATH W/STRAP (MISCELLANEOUS) ×3 IMPLANT
IV NS IRRIG 3000ML ARTHROMATIC (IV SOLUTION) ×3 IMPLANT
LOOP CUTTING 24FR OLYMPUS (CUTTING LOOP) IMPLANT
NS IRRIG 500ML POUR BTL (IV SOLUTION) ×3 IMPLANT
PACK CYSTO (CUSTOM PROCEDURE TRAY) ×3 IMPLANT
PLUG CATH AND CAP STER (CATHETERS) IMPLANT
SET ASPIRATION TUBING (TUBING) IMPLANT

## 2014-02-05 NOTE — Discharge Instructions (Signed)
°  Cystoscopy, Care After °Refer to this sheet in the next few weeks. These instructions provide you with information on caring for yourself after your procedure. Your caregiver may also give you more specific instructions. Your treatment has been planned according to current medical practices, but problems sometimes occur. Call your caregiver if you have any problems or questions after your procedure. °HOME CARE INSTRUCTIONS  °Things you can do to ease any discomfort after your procedure include: °· Drinking enough water and fluids to keep your urine clear or pale yellow. °· Taking a warm bath to relieve any burning feelings. °SEEK IMMEDIATE MEDICAL CARE IF:  °· You have an increase in blood in your urine. °· You notice blood clots in your urine. °· You have difficulty passing urine. °· You have the chills. °· You have abdominal pain. °· You have a fever or persistent symptoms for more than 2-3 days. °· You have a fever and your symptoms suddenly get worse. °MAKE SURE YOU:  °· Understand these instructions. °· Will watch your condition. °· Will get help right away if you are not doing well or get worse. °Document Released: 11/13/2004 Document Revised: 12/27/2012 Document Reviewed: 10/18/2011 °ExitCare® Patient Information ©2015 [employer]. This information is not intended to replace advice given to you by your health care provider. Make sure you discuss any questions you have with your health care provider. ° °Post Anesthesia Home Care Instructions ° °Activity: °Get plenty of rest for the remainder of the day. A responsible adult should stay with you for 24 hours following the procedure.  °For the next 24 hours, DO NOT: °-Drive a car °-Operate machinery °-Drink alcoholic beverages °-Take any medication unless instructed by your physician °-Make any legal decisions or sign important papers. ° °Meals: °Start with liquid foods such as gelatin or soup. Progress to regular foods as tolerated. Avoid greasy, spicy,  heavy foods. If nausea and/or vomiting occur, drink only clear liquids until the nausea and/or vomiting subsides. Call your physician if vomiting continues. ° °Special Instructions/Symptoms: °Your throat may feel dry or sore from the anesthesia or the breathing tube placed in your throat during surgery. If this causes discomfort, gargle with warm salt water. The discomfort should disappear within 24 hours. ° °

## 2014-02-05 NOTE — H&P (Signed)
History of Present Illness F/u - PA Tumey/Dr. Netty Starring.    1-dysuria-She's had some pelvic pain for a couple of years and underwent a transvaginal ultrasound February 2014. I found the images and these showed normal survey views of the kidney and a normal pelvis. The uterus was absent.   -August 2015- a month ago dysuria, bladder pressure, frequency and abdominal bloating. No gross hematuria. UA negative but showed some vaginal white blood cells and yeast. She was diagnosed with a UTI and vaginitis. No constipation. Voids with a good flow. She does have frequency and urgency. No Gu hx. She has undergone hysterectomy. She was on estrogen replacement in the past but had a concerning breast lump which turned out to be benign but she has been off estrogen replacement since then. NG risk include back surgery and degenerative disc disease. She has no lower extremity weakness or paresthesias and "bowel problems". She feels nothing bulging from the vagina and she does not have to reduce prolapsed to void or have bowel movements.      Sept 2015 interval hx  Patient returns in continuing evaluation of lower urinary tract symptoms.    She has a new issue today - microscopic hematuria. She quit smoking many years ago. She's not had exposure to chemotherapy, radiation, dye or solvents. She's had no gross hematuria. She has no history of stones.    PVR today is 45 mL.   Past Medical History Problems  1. History of Anxiety (300.00) 2. History of arthritis (V13.4) 3. History of cardiac disorder (V12.50) 4. History of esophageal reflux (V12.79) 5. History of hypercholesterolemia (V12.29) 6. History of hypertension (V12.59)  Surgical History Problems  1. History of Back Surgery 2. History of Cath Stent Placement 3. History of Cholecystectomy 4. History of Heart Surgery 5. History of Hysterectomy  Current Meds 1. Aspirin 81 MG Oral Tablet;  Therapy: (Recorded:26Aug2015) to Recorded 2.  Atorvastatin Calcium 40 MG Oral Tablet;  Therapy: (Recorded:26Aug2015) to Recorded 3. Benazepril HCl - 40 MG Oral Tablet;  Therapy: (Recorded:26Aug2015) to Recorded 4. Carvedilol 25 MG Oral Tablet;  Therapy: (Recorded:26Aug2015) to Recorded 5. Citalopram Hydrobromide 10 MG Oral Tablet;  Therapy: (Recorded:26Aug2015) to Recorded 6. Flonase 50 MCG/ACT SUSP;  Therapy: (Recorded:26Aug2015) to Recorded 7. Multi-Vitamin TABS;  Therapy: (Recorded:26Aug2015) to Recorded 8. NexIUM 40 MG Oral Capsule Delayed Release;  Therapy: (Recorded:26Aug2015) to Recorded  Allergies Medication  1. Biaxin TABS 2. Shellfish-derived Products 3. Sulfa Drugs 4. Amoxicillin TABS  Family History Problems  1. Family history of dementia (V17.2) : Mother 2. Family history of lung disease (V19.8) : Father 3. Family history of malignant neoplasm of urinary bladder (E52.77) : Father 4. Family history of Throat cancer : Father  Social History Problems  1. Daily caffeine consumption, 2-3 servings a day 2. Former smoker (V15.82) 3. Married 4. No alcohol use 5. Retired 49. Three children  Vitals Vital Signs [Data Includes: Last 1 Day]  Recorded: 17Sep2015 01:01PM  Blood Pressure: 123 / 64 Temperature: 98.9 F Heart Rate: 59  Physical Exam Abdomen: The abdomen is soft and nontender. No masses are palpated. No CVA tenderness. No hernias are palpable. No hepatosplenomegaly noted.  Genitourinary:  Chaperone Present: erica.  Examination of the external genitalia shows normal female external genitalia and no lesions. The urethra is normal in appearance and not tender. There is no urethral mass. Vaginal exam demonstrates no abnormalities. The adnexa are palpably normal. The bladder is non tender and not distended. The anus is normal on inspection. The perineum  is normal on inspection.    Results/Data Urine [Data Includes: Last 1 Day]   17Sep2015  COLOR YELLOW   APPEARANCE CLEAR   SPECIFIC GRAVITY 1.025   pH  5.0   GLUCOSE NEG mg/dL  BILIRUBIN NEG   KETONE NEG mg/dL  BLOOD SMALL   PROTEIN NEG mg/dL  UROBILINOGEN 0.2 mg/dL  NITRITE NEG   LEUKOCYTE ESTERASE NEG   SQUAMOUS EPITHELIAL/HPF RARE   WBC 0-2 WBC/hpf  RBC 11-20 RBC/hpf  BACTERIA RARE   CRYSTALS NONE SEEN   CASTS NONE SEEN   Other MUCUS NOTED    PVR: Ultrasound PVR 45 ml.    Procedure  Procedure: Cystoscopy   Indication: Hematuria. Lower Urinary Tract Symptoms.  Informed Consent: Risks, benefits, and potential adverse events were discussed and informed consent was obtained from the patient.  Prep: The patient was prepped with betadine.  Procedure Note:  Urethral meatus:. No abnormalities.  Anterior urethra: No abnormalities.  Bladder: Visulization was clear. The ureteral orifices were in the normal anatomic position bilaterally and had clear efflux of urine. 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 This tumor was located on the right side of the bladder. Just lateral to the ureteral orifice. The patient tolerated the procedure well.  Complications: None.    Assessment Assessed  1. Urinary urgency (788.63) 2. Microhematuria (599.72) 3. 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 Follow-up Schedule Surgery Office  Follow-up  Status: Hold For - Appointment   Requested for: 17Sep2015 Health Maintenance  2. UA With REFLEX; [Do Not Release]; Status:Complete;   Done: [id number] 12:43PM Microhematuria  3. 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 She'll need upper tract imaging and we discussed the nature of risks and benefits of CT hematuria protocol with IV contrast. She elects to proceed.    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 We discussed the nature risk and benefits of TURBT  with postop mitomycin-C. She elects to proceed. We discussed the possibility of a right ureteral stent but she may not need one.     Signatures Electronically signed by : Festus Aloe, M.D.; Jan 24 2014  1:58PM EST   ADD: CT Hematuria was normal apart from bladder mass just lateral to right UO. Complete antegrade pyelograms were obtained and normal.

## 2014-02-05 NOTE — Anesthesia Procedure Notes (Signed)
Procedure Name: LMA Insertion Date/Time: 02/05/2014 10:05 AM Performed by: Justice Rocher Pre-anesthesia Checklist: Patient identified, Emergency Drugs available, Suction available and Patient being monitored Patient Re-evaluated:Patient Re-evaluated prior to inductionOxygen Delivery Method: Circle System Utilized Preoxygenation: Pre-oxygenation with 100% oxygen Intubation Type: IV induction Ventilation: Mask ventilation without difficulty LMA: LMA inserted LMA Size: 4.0 Number of attempts: 1 Airway Equipment and Method: bite block Placement Confirmation: positive ETCO2 Tube secured with: Tape Dental Injury: Teeth and Oropharynx as per pre-operative assessment  Comments: No problems mask ventilating  No problems placing # 4 LMA orally + ETCO2 and BBS equal, taped - LB

## 2014-02-05 NOTE — Transfer of Care (Signed)
Immediate Anesthesia Transfer of Care Note  Patient: Meagan Osborne  Procedure(s) Performed: Procedure(s) (LRB): TRANSURETHRAL RESECTION OF BLADDER TUMOR  (N/A)  Patient Location: PACU  Anesthesia Type: General  Level of Consciousness: awake, sedated, patient cooperative and responds to stimulation  Airway & Oxygen Therapy: Patient Spontanous Breathing and Patient connected to face mask oxygen  Post-op Assessment: Report given to PACU RN, Post -op Vital signs reviewed and stable and Patient moving all extremities  Post vital signs: Reviewed and stable  Complications: No apparent anesthesia complications

## 2014-02-05 NOTE — Anesthesia Postprocedure Evaluation (Signed)
  Anesthesia Post-op Note  Patient: Meagan Osborne  Procedure(s) Performed: Procedure(s) (LRB): TRANSURETHRAL RESECTION OF BLADDER TUMOR  (N/A)  Patient Location: PACU  Anesthesia Type: General  Level of Consciousness: awake and alert   Airway and Oxygen Therapy: Patient Spontanous Breathing  Post-op Pain: mild  Post-op Assessment: Post-op Vital signs reviewed, Patient's Cardiovascular Status Stable, Respiratory Function Stable, Patent Airway and No signs of Nausea or vomiting  Last Vitals:  Filed Vitals:   02/05/14 1115  BP: 147/69  Pulse: 76  Temp:   Resp: 19    Post-op Vital Signs: stable   Complications: No apparent anesthesia complications

## 2014-02-05 NOTE — Op Note (Signed)
Preoperative diagnosis: Bladder neoplasm, frequency, urgency, pelvic pain Postoperative diagnosis: Same  Procedure: Exam under anesthesia TURBT 2-5 cm Instillation of Mitomycin-C in bladder in PACU  Surgeon: Junious Silk Anesthesia: Jillyn Hidden   type of anesthesia: General  Findings: On exam under anesthesia the urethra, bladder and pelvis were palpably normal on bimanual exam.  No masses were palpated.  Cystoscopy revealed a 29 French urethral meatus that required dilation to 28 [place].  The remainder of the urethra was normal.  The trigone and ureteral orifices were in the normal orthotopic position.  There was good clear reflux.  There were no stones or foreign bodies in the bladder.  There was a cluster of neoplastic activity about 2 cm lateral to the right ureteral orifice consisting of a dominant papillary tumor a smaller one more superior and some abnormal mucosa more medial forming sort of a triangle.  The base of the dominant tumor was also sent rolling out of that but seemed to be superficial.  All the tumor seemed to be superficial.  The 2 larger tumors were resected and the other area of abnormal mucosa was fulgurated.  There was excellent reflux from the right ureteral orifice after all resection and fulguration.  There was excellent hemostasis at low pressure.  Description of procedure: After consent was obtained the patient brought to the operating room.  After adequate anesthesia she was placed in the lithotomy position and prepped and draped in the usual sterile fashion.  A timeout was performed to confirm the patient and procedure.  An exam under anesthesia was performed.  The 23 French cystoscope would not advance per urethra and therefore the urethra was carefully dilated sequentially from 14 [place] up to 28 [place].The cystoscope was inserted per urethra and the bladder carefully examined with the 12 and 70 lens.  There were no other bladder abnormalities apart from what is noted above.   The loop was used to resect the large tumor in the smaller tumor.  I sent the tumor chips as bladder tumor and then resected the base and sent this as tumor base.Hemostasis was ensured.  I watched as the area and the right ureteral orifice at low pressure there was good clear reflux.  The bladder was refilled and the scope removed.  A 16 French Foley was placed draining clear urine.  The patient was awakened and taken to the recovery room in stable condition.  Complications: None  Blood loss: Minimal  Drains: 16 French Foley  Specimens: #1 bladder tumor #2 bladder tumor base These were sent to pathology

## 2014-02-05 NOTE — Interval H&P Note (Signed)
History and Physical Interval Note:  02/05/2014 10:05 AM  Meagan Osborne  has presented today for surgery, with the diagnosis of BLADER NEOPLASM  The various methods of treatment have been discussed with the patient and family. After consideration of risks, benefits and other options for treatment, the patient has consented to  Procedure(s): TRANSURETHRAL RESECTION OF BLADDER TUMOR WITH MITOMYCIN-C (N/A) as a surgical intervention .  The patient's history has been reviewed, patient examined, no change in status, stable for surgery.  I have reviewed the patient's chart and labs.  Questions were answered to the patient's satisfaction. She has no complaints today apart from pelvic pain. No dysuria or gross hematuria. Discussed this small tumor may or may not have anything to do with pelvic discomfort and I made no guarantees her pain would improve with removing the tumor, but it needs to come out. We discussed side effects of the proposed treatment, the likelihood of the patient achieving the goals of the procedure, and any potential problems that might occur during the procedure or recuperation. All questions answered. Patient elects to proceed.    Festus Aloe

## 2014-02-06 ENCOUNTER — Encounter (HOSPITAL_BASED_OUTPATIENT_CLINIC_OR_DEPARTMENT_OTHER): Payer: Self-pay | Admitting: Urology

## 2015-01-09 ENCOUNTER — Other Ambulatory Visit: Payer: Self-pay | Admitting: Rheumatology

## 2015-01-09 DIAGNOSIS — M25511 Pain in right shoulder: Secondary | ICD-10-CM

## 2015-01-09 DIAGNOSIS — M542 Cervicalgia: Secondary | ICD-10-CM

## 2015-01-16 ENCOUNTER — Ambulatory Visit
Admission: RE | Admit: 2015-01-16 | Discharge: 2015-01-16 | Disposition: A | Discharge status: Home / Self Care | Payer: Medicare Other | Source: Ambulatory Visit | Attending: Rheumatology | Admitting: Rheumatology

## 2015-01-16 DIAGNOSIS — M75101 Unspecified rotator cuff tear or rupture of right shoulder, not specified as traumatic: Secondary | ICD-10-CM | POA: Diagnosis not present

## 2015-01-16 DIAGNOSIS — M7591 Shoulder lesion, unspecified, right shoulder: Secondary | ICD-10-CM | POA: Insufficient documentation

## 2015-01-16 DIAGNOSIS — M542 Cervicalgia: Secondary | ICD-10-CM

## 2015-01-16 DIAGNOSIS — M7551 Bursitis of right shoulder: Secondary | ICD-10-CM | POA: Diagnosis not present

## 2015-01-16 DIAGNOSIS — G8929 Other chronic pain: Secondary | ICD-10-CM | POA: Diagnosis present

## 2015-01-16 DIAGNOSIS — M150 Primary generalized (osteo)arthritis: Secondary | ICD-10-CM | POA: Diagnosis present

## 2015-01-16 DIAGNOSIS — M25511 Pain in right shoulder: Secondary | ICD-10-CM | POA: Diagnosis present

## 2015-01-16 DIAGNOSIS — M19011 Primary osteoarthritis, right shoulder: Secondary | ICD-10-CM | POA: Diagnosis not present

## 2015-02-12 ENCOUNTER — Other Ambulatory Visit: Payer: Medicare Other

## 2015-02-13 ENCOUNTER — Encounter
Admission: RE | Admit: 2015-02-13 | Discharge: 2015-02-13 | Disposition: A | Discharge status: Home / Self Care | Payer: Medicare Other | Source: Ambulatory Visit | Attending: Surgery | Admitting: Surgery

## 2015-02-13 DIAGNOSIS — Z01818 Encounter for other preprocedural examination: Secondary | ICD-10-CM | POA: Diagnosis not present

## 2015-02-13 DIAGNOSIS — M75121 Complete rotator cuff tear or rupture of right shoulder, not specified as traumatic: Secondary | ICD-10-CM | POA: Diagnosis not present

## 2015-02-13 HISTORY — DX: Type 2 diabetes mellitus without complications: E11.9

## 2015-02-13 HISTORY — DX: Headache, unspecified: R51.9

## 2015-02-13 HISTORY — DX: Malignant (primary) neoplasm, unspecified: C80.1

## 2015-02-13 LAB — URINALYSIS COMPLETE WITH MICROSCOPIC (ARMC ONLY)
Bacteria, UA: NONE SEEN
Bilirubin Urine: NEGATIVE
GLUCOSE, UA: NEGATIVE mg/dL
Hgb urine dipstick: NEGATIVE
Ketones, ur: NEGATIVE mg/dL
Leukocytes, UA: NEGATIVE
NITRITE: NEGATIVE
Protein, ur: NEGATIVE mg/dL
SPECIFIC GRAVITY, URINE: 1.014 (ref 1.005–1.030)
pH: 5 (ref 5.0–8.0)

## 2015-02-13 NOTE — Patient Instructions (Signed)
  Your procedure is scheduled on: 02-20-15 (THURSDAY) Report to [facility] To find out your arrival time please call [phone] between 1PM - 3PM on 02-19-15 [facility])  Remember: Instructions that are not followed completely may result in serious medical risk, up to and including death, or upon the discretion of your surgeon and anesthesiologist your surgery may need to be rescheduled.    _X___ 1. Do not eat food or drink liquids after midnight. No gum chewing or hard candies.     _X___ 2. No Alcohol for 24 hours before or after surgery.   ____ 3. Bring all medications with you on the day of surgery if instructed.    _X___ 4. Notify your doctor if there is any change in your medical condition     (cold, fever, infections).     Do not wear jewelry, make-up, hairpins, clips or nail polish.  Do not wear lotions, powders, or perfumes. You may wear deodorant.  Do not shave 48 hours prior to surgery. Men may shave face and neck.  Do not bring valuables to the hospital.    [facility] is not responsible for any belongings or valuables.               Contacts, dentures or bridgework may not be worn into surgery.  Leave your suitcase in the car. After surgery it may be brought to your room.  For patients admitted to the hospital, discharge time is determined by your treatment team.   Patients discharged the day of surgery will not be allowed to drive home.   Please read over the following fact sheets that you were given:      _X___ Take these medicines the morning of surgery with A SIP OF WATER:    1. [place]  2. COREG  3. LOTENSIN  4.  5.  6.  ____ Fleet Enema (as directed)   _X___ Use CHG Soap as directed  ____ Use inhalers on the day of surgery  ____ Stop metformin 2 days prior to surgery    ____ Take 1/2 of usual insulin dose the night before surgery and none on the morning of surgery.   ____ Stop Coumadin/Plavix/aspirin-CONTINUE 72 MG  ASPIRIN PER DR CARDIOLOGIST  ____ Stop Anti-inflammatories-NO NSAIDS OR ASPIRIN PRODUCTS-TYLENOL OK   ____ Stop supplements until after surgery.    ____ Bring C-Pap to the hospital.

## 2015-02-20 ENCOUNTER — Ambulatory Visit: Payer: Medicare Other | Admitting: Anesthesiology

## 2015-02-20 ENCOUNTER — Ambulatory Visit
Admission: RE | Admit: 2015-02-20 | Discharge: 2015-02-20 | Disposition: A | Discharge status: Home / Self Care | Payer: Medicare Other | Source: Ambulatory Visit | Attending: Surgery | Admitting: Surgery

## 2015-02-20 ENCOUNTER — Encounter
Admission: RE | Disposition: A | Discharge status: Home / Self Care | Payer: Self-pay | Source: Ambulatory Visit | Attending: Surgery

## 2015-02-20 ENCOUNTER — Encounter: Payer: Self-pay | Admitting: *Deleted

## 2015-02-20 DIAGNOSIS — H543 Unqualified visual loss, both eyes: Secondary | ICD-10-CM | POA: Diagnosis not present

## 2015-02-20 DIAGNOSIS — F419 Anxiety disorder, unspecified: Secondary | ICD-10-CM | POA: Diagnosis not present

## 2015-02-20 DIAGNOSIS — I2510 Atherosclerotic heart disease of native coronary artery without angina pectoris: Secondary | ICD-10-CM | POA: Diagnosis not present

## 2015-02-20 DIAGNOSIS — M75111 Incomplete rotator cuff tear or rupture of right shoulder, not specified as traumatic: Secondary | ICD-10-CM | POA: Insufficient documentation

## 2015-02-20 DIAGNOSIS — Z809 Family history of malignant neoplasm, unspecified: Secondary | ICD-10-CM | POA: Insufficient documentation

## 2015-02-20 DIAGNOSIS — Z9049 Acquired absence of other specified parts of digestive tract: Secondary | ICD-10-CM | POA: Diagnosis not present

## 2015-02-20 DIAGNOSIS — Z8551 Personal history of malignant neoplasm of bladder: Secondary | ICD-10-CM | POA: Diagnosis not present

## 2015-02-20 DIAGNOSIS — N6019 Diffuse cystic mastopathy of unspecified breast: Secondary | ICD-10-CM | POA: Diagnosis not present

## 2015-02-20 DIAGNOSIS — Z820 Family history of epilepsy and other diseases of the nervous system: Secondary | ICD-10-CM | POA: Diagnosis not present

## 2015-02-20 DIAGNOSIS — Z887 Allergy status to serum and vaccine status: Secondary | ICD-10-CM | POA: Insufficient documentation

## 2015-02-20 DIAGNOSIS — Z955 Presence of coronary angioplasty implant and graft: Secondary | ICD-10-CM | POA: Insufficient documentation

## 2015-02-20 DIAGNOSIS — Z882 Allergy status to sulfonamides status: Secondary | ICD-10-CM | POA: Diagnosis not present

## 2015-02-20 DIAGNOSIS — Z881 Allergy status to other antibiotic agents status: Secondary | ICD-10-CM | POA: Insufficient documentation

## 2015-02-20 DIAGNOSIS — F329 Major depressive disorder, single episode, unspecified: Secondary | ICD-10-CM | POA: Insufficient documentation

## 2015-02-20 DIAGNOSIS — M7541 Impingement syndrome of right shoulder: Secondary | ICD-10-CM | POA: Diagnosis present

## 2015-02-20 DIAGNOSIS — M19011 Primary osteoarthritis, right shoulder: Secondary | ICD-10-CM | POA: Insufficient documentation

## 2015-02-20 DIAGNOSIS — E785 Hyperlipidemia, unspecified: Secondary | ICD-10-CM | POA: Diagnosis not present

## 2015-02-20 DIAGNOSIS — Z888 Allergy status to other drugs, medicaments and biological substances status: Secondary | ICD-10-CM | POA: Diagnosis not present

## 2015-02-20 DIAGNOSIS — E538 Deficiency of other specified B group vitamins: Secondary | ICD-10-CM | POA: Diagnosis not present

## 2015-02-20 DIAGNOSIS — Z9841 Cataract extraction status, right eye: Secondary | ICD-10-CM | POA: Insufficient documentation

## 2015-02-20 DIAGNOSIS — Z8249 Family history of ischemic heart disease and other diseases of the circulatory system: Secondary | ICD-10-CM | POA: Diagnosis not present

## 2015-02-20 DIAGNOSIS — I10 Essential (primary) hypertension: Secondary | ICD-10-CM | POA: Insufficient documentation

## 2015-02-20 DIAGNOSIS — Z87891 Personal history of nicotine dependence: Secondary | ICD-10-CM | POA: Insufficient documentation

## 2015-02-20 DIAGNOSIS — Z9842 Cataract extraction status, left eye: Secondary | ICD-10-CM | POA: Insufficient documentation

## 2015-02-20 DIAGNOSIS — Z7982 Long term (current) use of aspirin: Secondary | ICD-10-CM | POA: Insufficient documentation

## 2015-02-20 DIAGNOSIS — Z833 Family history of diabetes mellitus: Secondary | ICD-10-CM | POA: Diagnosis not present

## 2015-02-20 DIAGNOSIS — L409 Psoriasis, unspecified: Secondary | ICD-10-CM | POA: Insufficient documentation

## 2015-02-20 DIAGNOSIS — Z836 Family history of other diseases of the respiratory system: Secondary | ICD-10-CM | POA: Insufficient documentation

## 2015-02-20 HISTORY — PX: SHOULDER ARTHROSCOPY WITH OPEN ROTATOR CUFF REPAIR: SHX6092

## 2015-02-20 LAB — GLUCOSE, CAPILLARY
GLUCOSE-CAPILLARY: 124 mg/dL — AB (ref 65–99)
Glucose-Capillary: 143 mg/dL — ABNORMAL HIGH (ref 65–99)

## 2015-02-20 SURGERY — ARTHROSCOPY, SHOULDER WITH REPAIR, ROTATOR CUFF, OPEN
Anesthesia: General | Site: Shoulder | Laterality: Right | Wound class: Clean

## 2015-02-20 MED ORDER — GLYCOPYRROLATE 0.2 MG/ML IJ SOLN
INTRAMUSCULAR | Status: DC | PRN
  Administered 2015-02-20: 0.2 mg via INTRAVENOUS

## 2015-02-20 MED ORDER — ONDANSETRON HCL 4 MG/2ML IJ SOLN
INTRAMUSCULAR | Status: DC | PRN
  Administered 2015-02-20: 4 mg via INTRAVENOUS

## 2015-02-20 MED ORDER — PHENYLEPHRINE HCL 10 MG/ML IJ SOLN
INTRAMUSCULAR | Status: DC | PRN
  Administered 2015-02-20: 50 ug via INTRAVENOUS
  Administered 2015-02-20: 100 ug via INTRAVENOUS

## 2015-02-20 MED ORDER — EPINEPHRINE HCL 1 MG/ML IJ SOLN
INTRAMUSCULAR | Status: AC
  Filled 2015-02-20: qty 1

## 2015-02-20 MED ORDER — SODIUM CHLORIDE 0.9 % IV SOLN
INTRAVENOUS | Status: DC
  Administered 2015-02-20 (×2): via INTRAVENOUS

## 2015-02-20 MED ORDER — CLINDAMYCIN PHOSPHATE 900 MG/50ML IV SOLN
INTRAVENOUS | Status: AC
  Filled 2015-02-20: qty 50

## 2015-02-20 MED ORDER — BUPIVACAINE-EPINEPHRINE 0.5% -1:200000 IJ SOLN
INTRAMUSCULAR | Status: DC | PRN
  Administered 2015-02-20: 20 mL

## 2015-02-20 MED ORDER — OXYCODONE HCL 5 MG PO TABS: 5.0000 mg | ORAL_TABLET | ORAL | Status: DC | PRN

## 2015-02-20 MED ORDER — SUCCINYLCHOLINE CHLORIDE 20 MG/ML IJ SOLN
INTRAMUSCULAR | Status: DC | PRN
  Administered 2015-02-20: 100 mg via INTRAVENOUS

## 2015-02-20 MED ORDER — FENTANYL CITRATE (PF) 100 MCG/2ML IJ SOLN
INTRAMUSCULAR | Status: DC | PRN
  Administered 2015-02-20: 100 ug via INTRAVENOUS

## 2015-02-20 MED ORDER — OXYCODONE HCL 5 MG/5ML PO SOLN: 5.0000 mg | Freq: Once | ORAL | Status: DC | PRN

## 2015-02-20 MED ORDER — EPINEPHRINE HCL 1 MG/ML IJ SOLN
INTRAMUSCULAR | Status: DC | PRN
  Administered 2015-02-20: 2 mL

## 2015-02-20 MED ORDER — CLINDAMYCIN PHOSPHATE 900 MG/50ML IV SOLN: 900.0000 mg | Freq: Once | INTRAVENOUS | Status: DC

## 2015-02-20 MED ORDER — BUPIVACAINE HCL (PF) 0.5 % IJ SOLN
INTRAMUSCULAR | Status: DC | PRN
  Administered 2015-02-20 (×2): 10 mL

## 2015-02-20 MED ORDER — EPHEDRINE SULFATE 50 MG/ML IJ SOLN
INTRAMUSCULAR | Status: DC | PRN
  Administered 2015-02-20: 10 mg via INTRAVENOUS
  Administered 2015-02-20: 5 mg via INTRAVENOUS

## 2015-02-20 MED ORDER — EPINEPHRINE HCL 1 MG/ML IJ SOLN
INTRAMUSCULAR | Status: AC
  Filled 2015-02-20: qty 2

## 2015-02-20 MED ORDER — OXYCODONE HCL 5 MG PO TABS: 5.0000 mg | ORAL_TABLET | Freq: Once | ORAL | Status: DC | PRN

## 2015-02-20 MED ORDER — BUPIVACAINE HCL (PF) 0.5 % IJ SOLN
INTRAMUSCULAR | Status: AC
  Filled 2015-02-20: qty 30

## 2015-02-20 MED ORDER — PROPOFOL 10 MG/ML IV BOLUS
INTRAVENOUS | Status: DC | PRN
  Administered 2015-02-20: 120 mg via INTRAVENOUS

## 2015-02-20 MED ORDER — BUPIVACAINE-EPINEPHRINE (PF) 0.5% -1:200000 IJ SOLN
INTRAMUSCULAR | Status: AC
  Filled 2015-02-20: qty 30

## 2015-02-20 MED ORDER — FENTANYL CITRATE (PF) 100 MCG/2ML IJ SOLN: 25.0000 ug | INTRAMUSCULAR | Status: DC | PRN

## 2015-02-20 MED ORDER — LIDOCAINE HCL (PF) 1 % IJ SOLN
INTRAMUSCULAR | Status: DC | PRN
  Administered 2015-02-20: 1 mL via INTRADERMAL

## 2015-02-20 MED ORDER — MIDAZOLAM HCL 2 MG/2ML IJ SOLN
INTRAMUSCULAR | Status: DC | PRN
  Administered 2015-02-20: 2 mg via INTRAVENOUS

## 2015-02-20 SURGICAL SUPPLY — 47 items
ANCHOR JUGGERKNOT WTAP NDL 2.9 (Anchor) ×3 IMPLANT
BIT DRILL JUGRKNT W/NDL BIT2.9 (DRILL) ×1 IMPLANT
BLADE FULL RADIUS 3.5 (BLADE) ×3 IMPLANT
BLADE SHAVER 4.5X7 STR FR (MISCELLANEOUS) ×3 IMPLANT
BUR ACROMIONIZER 4.0 (BURR) ×3 IMPLANT
BUR BR 5.5 WIDE MOUTH (BURR) ×3 IMPLANT
CANNULA 8.5X75 THRED (CANNULA) ×3 IMPLANT
CANNULA SHAVER 8MMX76MM (CANNULA) ×3 IMPLANT
CHLORAPREP W/TINT 26ML (MISCELLANEOUS) ×6 IMPLANT
COVER MAYO STAND STRL (DRAPES) ×3 IMPLANT
DRAPE IMP U-DRAPE 54X76 (DRAPES) ×3 IMPLANT
DRAPE SURG 17X11 SM STRL (DRAPES) ×3 IMPLANT
DRILL JUGGERKNOT W/NDL BIT 2.9 (DRILL) ×3
DRSG OPSITE POSTOP 4X8 (GAUZE/BANDAGES/DRESSINGS) ×3 IMPLANT
GAUZE PETRO XEROFOAM 1X8 (MISCELLANEOUS) ×3 IMPLANT
GAUZE SPONGE 4X4 12PLY STRL (GAUZE/BANDAGES/DRESSINGS) ×3 IMPLANT
GLOVE BIO SURGEON STRL SZ7.5 (GLOVE) ×6 IMPLANT
GLOVE BIO SURGEON STRL SZ8 (GLOVE) ×6 IMPLANT
GLOVE BIOGEL PI IND STRL 8 (GLOVE) ×1 IMPLANT
GLOVE BIOGEL PI INDICATOR 8 (GLOVE) ×2
GLOVE INDICATOR 8.0 STRL GRN (GLOVE) ×3 IMPLANT
GOWN STRL REUS W/ TWL LRG LVL3 (GOWN DISPOSABLE) ×2 IMPLANT
GOWN STRL REUS W/ TWL XL LVL3 (GOWN DISPOSABLE) ×1 IMPLANT
GOWN STRL REUS W/TWL LRG LVL3 (GOWN DISPOSABLE) ×4
GOWN STRL REUS W/TWL XL LVL3 (GOWN DISPOSABLE) ×2
GRASPER SUT 15 45D LOW PRO (SUTURE) IMPLANT
IV LACTATED RINGER IRRG 3000ML (IV SOLUTION) ×4
IV LR IRRIG 3000ML ARTHROMATIC (IV SOLUTION) ×2 IMPLANT
MANIFOLD NEPTUNE II (INSTRUMENTS) ×3 IMPLANT
MASK FACE SPIDER DISP (MASK) ×3 IMPLANT
MAT BLUE FLOOR 46X72 FLO (MISCELLANEOUS) ×3 IMPLANT
NEEDLE REVERSE CUT 1/2 CRC (NEEDLE) IMPLANT
PACK ARTHROSCOPY SHOULDER (MISCELLANEOUS) ×3 IMPLANT
PAD GROUND ADULT SPLIT (MISCELLANEOUS) ×3 IMPLANT
SLING ARM LRG DEEP (SOFTGOODS) IMPLANT
SLING ULTRA II LG (MISCELLANEOUS) ×3 IMPLANT
STAPLER SKIN PROX 35W (STAPLE) ×3 IMPLANT
STRAP SAFETY BODY (MISCELLANEOUS) ×3 IMPLANT
SUT ETHIBOND 0 MO6 C/R (SUTURE) ×3 IMPLANT
SUT PROLENE 4 0 PS 2 18 (SUTURE) ×3 IMPLANT
SUT VIC AB 2-0 CT1 27 (SUTURE) ×4
SUT VIC AB 2-0 CT1 TAPERPNT 27 (SUTURE) ×2 IMPLANT
TAPE MICROFOAM 4IN (TAPE) ×3 IMPLANT
TUBING ARTHRO INFLOW-ONLY STRL (TUBING) ×3 IMPLANT
TUBING CONNECTING 10 (TUBING) ×2 IMPLANT
TUBING CONNECTING 10' (TUBING) ×1
WAND HAND CNTRL MULTIVAC 90 (MISCELLANEOUS) ×3 IMPLANT

## 2015-02-20 NOTE — Transfer of Care (Signed)
Immediate Anesthesia Transfer of Care Note  Patient: Meagan Osborne  Procedure(s) Performed: Procedure(s): SHOULDER ARTHROSCOPY WITH OPEN ROTATOR CUFF REPAIR and decompression (Right)  Patient Location: PACU  Anesthesia Type:General and Regional  Level of Consciousness: awake, alert  and oriented  Airway & Oxygen Therapy: Patient Spontanous Breathing and Patient connected to face mask oxygen  Post-op Assessment: Report given to RN, Post -op Vital signs reviewed and stable and Post -op Vital signs reviewed and unstable, Anesthesiologist notified  Post vital signs: Reviewed and stable  Last Vitals:  Filed Vitals:   02/20/15 0925  BP: 148/72  Pulse: 67  Temp: 36.8 C  Resp: 18    Complications: No apparent anesthesia complications

## 2015-02-20 NOTE — Anesthesia Procedure Notes (Addendum)
Anesthesia Regional Block:  Interscalene brachial plexus block  Pre-Anesthetic Checklist: ,, timeout performed, Correct Patient, Correct Site, Correct Laterality, Correct Procedure, Correct Position, site marked, Risks and benefits discussed,  Surgical consent,  Pre-op evaluation,  At surgeon's request and post-op pain management  Laterality: Right and Upper  Prep: chloraprep       Needles:  Injection technique: Single-shot  Needle Type: Stimiplex     Needle Length: 5cm 5 cm Needle Gauge: 22 and 22 G    Additional Needles:  Procedures: ultrasound guided (picture in chart) Interscalene brachial plexus block Narrative:  Start time: 02/20/2015 7:44 AM End time: 02/20/2015 7:47 AM Injection made incrementally with aspirations every 5 mL.  Performed by: Personally  Anesthesiologist: Katy Fitch K  Additional Notes: Functioning IV was confirmed and monitors were applied.  A 88mm 22ga Stimuplex needle was used. Sterile prep and drape,hand hygiene and sterile gloves were used.  Negative aspiration and negative test dose prior to incremental administration of local anesthetic. The patient tolerated the procedure well.      Procedure Name: Intubation Date/Time: 02/20/2015 7:43 AM Performed by: Andria Frames Pre-anesthesia Checklist: Patient identified, Patient being monitored, Timeout performed, Emergency Drugs available and Suction available Patient Re-evaluated:Patient Re-evaluated prior to inductionOxygen Delivery Method: Circle system utilized Preoxygenation: Pre-oxygenation with 100% oxygen Intubation Type: IV induction Ventilation: Mask ventilation without difficulty Laryngoscope Size: 3 and McGraph Grade View: Grade II Tube type: Oral Tube size: 7.0 mm Number of attempts: 1 Airway Equipment and Method: Stylet Placement Confirmation: ETT inserted through vocal cords under direct vision,  positive ETCO2 and breath sounds checked- equal and  bilateral Secured at: 21 cm Tube secured with: Tape Dental Injury: Teeth and Oropharynx as per pre-operative assessment

## 2015-02-20 NOTE — Discharge Instructions (Addendum)
Keep dressing dry and intact.  °May shower after dressing changed on post-op day #4 (Monday).  °Cover staples/sutures with Band-Aids after drying off. °Apply ice frequently to shoulder. °Keep shoulder immobilizer on at all times except may remove for bathing purposes. °Follow-up in 10-14 days or as scheduled.AMBULATORY SURGERY  °DISCHARGE INSTRUCTIONS ° ° °1) The drugs that you were given will stay in your system until tomorrow so for the next 24 hours you should not: ° °A) Drive an automobile °B) Make any legal decisions °C) Drink any alcoholic beverage ° ° °2) You may resume regular meals tomorrow.  Today it is better to start with liquids and gradually work up to solid foods. ° °You may eat anything you prefer, but it is better to start with liquids, then soup and crackers, and gradually work up to solid foods. ° ° °3) Please notify your doctor immediately if you have any unusual bleeding, trouble breathing, redness and pain at the surgery site, drainage, fever, or pain not relieved by medication. ° ° ° °4) Additional Instructions: ° ° ° ° ° ° ° °Please contact your physician with any problems or Same Day Surgery at [phone], Monday through Friday 6 am to 4 pm, or [facility] number at [phone]. °

## 2015-02-20 NOTE — Op Note (Signed)
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  Postoperative diagnosis: Impingement/tendinopathy with near full-thickness rotator cuff tear, labral fraying, and moderate degenerative joint disease, right shoulder.  Procedure: Limited arthroscopic debridement, arthroscopic subacromial decompression, and mini-open rotator cuff repair, right shoulder.  Anesthesia: General endotracheal with interscalene block placed preoperatively by the anesthesiologist.  Surgeon:   Pascal Lux, MD  Assistant:   None  Findings: As above. There were grade 2-3 chondromalacial changes involving both the humeral head and glenoid. There was labral fraying superiorly and anteriorly without detachment from the glenoid. The biceps tendon was in satisfactory condition. There was extensive tendinopathy changes of the insertional fibers of the supraspinatus tendon with a near full-thickness component.  Complications: None  Fluids:   1000 cc  Estimated blood loss: 5 cc  Tourniquet time: None  Drains: None  Closure: Staples   Brief clinical note: The patient is a 69 year old female with a history of progressively worsening right shoulder pain. The patient's symptoms have progressed despite medications, activity modification, etc. The patient's history and examination are consistent with impingement/tendinopathy with a rotator cuff tear. These findings were confirmed by MRI scan. The patient presents at this time for definitive management of these shoulder symptoms.  Procedure: The patient was brought into the operating room and lain in the supine position. After adequate IV sedation was achieved, the patient underwent placement of an interscalene block by the anesthesiologist. The patient then underwent general endotracheal intubation and anesthesia before being repositioned in the  beach chair position using the beach chair positioner. The right shoulder and upper extremity were prepped with ChloraPrep solution before being draped sterilely. Preoperative antibiotics were administered. A timeout was performed to confirm the proper surgical site before the expected portal sites and incision site were injected with 0.5% Sensorcaine with epinephrine. A posterior portal was created and the glenohumeral joint thoroughly inspected with the findings as described above. An anterior portal was created using an outside-in technique. The labrum and rotator cuff were further probed, again confirming the above-noted findings. The full radius resector was inserted and used to debride the frayed portions of the labrum superiorly and anteriorly, as well as to perform an abrasion chondroplasty on the areas of loose articular cartilage on the humeral head and central glenoid. The labrum was carefully probed, confirming the above-noted findings, and the biceps tendon was inspected by drawing it into the joint, again confirming the above-noted findings. The ArthroCare wand was inserted and used to obtain hemostasis as well as to "anneal" the labrum superiorly and anteriorly. The instruments were removed from the joint after suctioning the excess fluid.  The camera was repositioned through the posterior portal into the subacromial space. A separate lateral portal was created using an outside-in technique. The 3.5 mm full-radius resector was introduced and used to perform a subtotal bursectomy. The ArthroCare wand was then inserted and used to remove the periosteal tissue off the undersurface of the anterior third of the acromion as well as to recess the coracoacromial ligament from its attachment along the anterior and lateral margins of the acromion. The 4.0 mm acromionizing bur was introduced and used to complete the decompression by removing the undersurface of the anterior third of the acromion. The full  radius resector was reintroduced to remove any residual bony debris before the ArthroCare wand was reintroduced to obtain hemostasis. The instruments were then removed from the subacromial space  after suctioning the excess fluid.  An approximately 3.5-4 cm incision was made over the anterolateral aspect of the shoulder beginning at the anterolateral corner of the acromion and extending distally in line with the bicipital groove. This incision was carried down through the subcutaneous tissues to expose the deltoid fascia. The raphae between the anterior and middle thirds was identified and this plane developed to provide access into the subacromial space. Additional bursal tissues were debrided sharply using Metzenbaum scissors. The area of partial-thickness tearing of the rotator cuff tear was readily identified. The central portion of the thinned out area of the supraspinatus insertional fibers was incised along the lines of their fibers with a #15 blade and the degenerative portions of the tendon debrided sharply. The exposed greater tuberosity roughened with a rongeur. The tear was repaired using a single Biomet 2.9 mm JuggerKnot anchor. Several additional #0 Ethibond interrupted sutures were placed in a side-to-side fashion to further reinforce the repair. An apparent watertight closure was obtained.  The wound was copiously irrigated with sterile saline solution before the deltoid raphae was reapproximated using 2-0 Vicryl interrupted sutures. The subcutaneous tissues were closed in two layers using 2-0 Vicryl interrupted sutures before the skin was closed using staples. The portal sites also were closed using staples. A sterile bulky dressing was applied to the shoulder before the arm was placed into a shoulder immobilizer. The patient was then awakened, extubated, and returned to the recovery room in satisfactory condition after tolerating the procedure well.

## 2015-02-20 NOTE — Anesthesia Postprocedure Evaluation (Signed)
  Anesthesia Post-op Note  Patient: Meagan Osborne  Procedure(s) Performed: Procedure(s): SHOULDER ARTHROSCOPY WITH OPEN ROTATOR CUFF REPAIR and decompression (Right)  Anesthesia type:General ETT  Patient location: PACU  Post pain: Pain level controlled  Post assessment: Post-op Vital signs reviewed, Patient's Cardiovascular Status Stable, Respiratory Function Stable, Patent Airway and No signs of Nausea or vomiting  Post vital signs: Reviewed and stable  Last Vitals:  Filed Vitals:   02/20/15 1120  BP: 171/81  Pulse: 106  Temp: 36.6 C  Resp: 16    Level of consciousness: awake, alert  and patient cooperative  Complications: No apparent anesthesia complications

## 2015-02-20 NOTE — H&P (Signed)
Paper H&P to be scanned into permanent record. H&P reviewed. No changes. 

## 2015-02-20 NOTE — Anesthesia Preprocedure Evaluation (Addendum)
Anesthesia Evaluation  Patient identified by MRN, date of birth, ID band Patient awake    Reviewed: Allergy & Precautions, H&P , NPO status , Patient's Chart, lab work & pertinent test results  History of Anesthesia Complications (+) PONV, DIFFICULT AIRWAY and history of anesthetic complications  Airway Mallampati: III  TM Distance: >3 FB Neck ROM: limited    Dental  (+) Teeth Intact   Pulmonary neg shortness of breath, former smoker,    Pulmonary exam normal breath sounds clear to auscultation       Cardiovascular Exercise Tolerance: Good hypertension, + CAD and + Cardiac Stents  (-) Past MI Normal cardiovascular exam+ dysrhythmias  Rhythm:regular Rate:Normal     Neuro/Psych  Headaches, negative psych ROS   GI/Hepatic Neg liver ROS, hiatal hernia, GERD  Controlled,  Endo/Other  diabetes, Type 2  Renal/GU negative Renal ROS  negative genitourinary   Musculoskeletal  (+) Arthritis ,   Abdominal   Peds  Hematology  (+) Blood dyscrasia, ,   Anesthesia Other Findings Past Medical History:   Hyperlipidemia                                               Arthritis                                                    Osteoporosis                                                 Chronic back pain                                            Psoriasis                                                    H/O hiatal hernia                                            Diverticulosis                                               Urinary frequency                                            Coronary artery disease  Comment:S/P  DES X2 TO MLAD   Hypertension                                                 GERD (gastroesophageal reflux disease)                       Bladder neoplasm                                             Multilevel spondylosis                                       Nocturia                                                      Dysrhythmia                                                    Comment:symptomatic   Headache                                                       Comment:h/o migraines   Cancer ([place])                                    01-2014         Comment:bladder   Blood dyscrasia                                                Comment:retroperitoneal hematoma   Diabetes mellitus without complication (HCC)                   Comment:borderline-A1C 6.6   PONV (postoperative nausea and vomiting)                     DIFFICULT INTUBATION                                           Comment:pt states that anesthesia told her that she has              a crooked airway--  REFER TO ANESTHESIA  RECORD              FROM 06-07-2011 SURG. AT [facility]-- GLIDESCOPE USED              WITHOUT  ISSUES  BMI    Body Mass Index   29.49 kg/m 2   Baseline numbness and weakness in right arm   Patient has cardiac clearance   Reproductive/Obstetrics negative OB ROS                            Anesthesia Physical Anesthesia Plan  ASA: III  Anesthesia Plan: General ETT   Post-op Pain Management: GA combined w/ Regional for post-op pain   Induction:   Airway Management Planned:   Additional Equipment:   Intra-op Plan:   Post-operative Plan:   Informed Consent: I have reviewed the patients History and Physical, chart, labs and discussed the procedure including the risks, benefits and alternatives for the proposed anesthesia with the patient or authorized representative who has indicated his/her understanding and acceptance.   Dental Advisory Given  Plan Discussed with: Anesthesiologist, CRNA and Surgeon  Anesthesia Plan Comments:         Anesthesia Quick Evaluation

## 2015-04-15 ENCOUNTER — Other Ambulatory Visit: Payer: Self-pay | Admitting: Urology

## 2015-04-17 ENCOUNTER — Other Ambulatory Visit: Payer: Self-pay | Admitting: Urology

## 2015-05-08 ENCOUNTER — Encounter (HOSPITAL_BASED_OUTPATIENT_CLINIC_OR_DEPARTMENT_OTHER): Payer: Self-pay | Admitting: *Deleted

## 2015-05-09 ENCOUNTER — Encounter (HOSPITAL_BASED_OUTPATIENT_CLINIC_OR_DEPARTMENT_OTHER): Payer: Self-pay | Admitting: *Deleted

## 2015-05-13 ENCOUNTER — Encounter (HOSPITAL_BASED_OUTPATIENT_CLINIC_OR_DEPARTMENT_OTHER): Payer: Self-pay | Admitting: *Deleted

## 2015-05-13 NOTE — Progress Notes (Signed)
NPO AFTER MN.  ARRIVE AT 0600.  NEEDS ISTAT .  CURRENT EKG IN EPIC UNDER CARE EVERYWHERE TAB, DONE 02-22-2015.  WILL TAKE COREG AM DOS W/ SIPS OF WATER.

## 2015-05-15 NOTE — Anesthesia Preprocedure Evaluation (Signed)
Anesthesia Evaluation  Patient identified by MRN, date of birth, ID band Patient awake    Reviewed: Allergy & Precautions, H&P , NPO status , Patient's Chart, lab work & pertinent test results, reviewed documented beta blocker date and time   History of Anesthesia Complications (+) PONV, DIFFICULT AIRWAY and history of anesthetic complications (Hx of having a "crooked" airway at [facility] and postoperative horseness/sore thoat. Intubated in 2013 at [facility] and was an easy mask, grade 1 view with glidescope and  ETT passed without difficulty.)  Airway Mallampati: II  TM Distance: >3 FB Neck ROM: Limited  Mouth opening: Limited Mouth Opening  Dental no notable dental hx. (+) Dental Advisory Given, Teeth Intact   Pulmonary neg pulmonary ROS, former smoker,    Pulmonary exam normal breath sounds clear to auscultation       Cardiovascular Exercise Tolerance: Good hypertension, Pt. on medications and Pt. on home beta blockers + CAD and + Cardiac Stents (stent in 08 and 09, no problems since, >4METS)  Normal cardiovascular exam Rhythm:Regular Rate:Normal     Neuro/Psych TIAnegative psych ROS   GI/Hepatic Neg liver ROS, hiatal hernia, GERD  Medicated and Controlled,  Endo/Other  negative endocrine ROS  Renal/GU negative Renal ROS  negative genitourinary   Musculoskeletal  (+) Arthritis , Osteoarthritis,    Abdominal   Peds negative pediatric ROS (+)  Hematology negative hematology ROS (+)   Anesthesia Other Findings   Reproductive/Obstetrics negative OB ROS                             Anesthesia Physical Anesthesia Plan  ASA: III  Anesthesia Plan: General   Post-op Pain Management:    Induction: Intravenous  Airway Management Planned: LMA  Additional Equipment:   Intra-op Plan:   Post-operative Plan:   Informed Consent:   Plan Discussed with: Surgeon  Anesthesia Plan Comments:          Anesthesia Quick Evaluation

## 2015-05-15 NOTE — H&P (Signed)
History of Present Illness F/u - [facility]/Dr. Netty Starring.    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 Nl EUA. Presented with microscopic hematuria and irritative symptoms.  Last upper tract: Sept 2015 CT hematuria  Last cytology: Mar 2016 - negative  Last cystoscopy: Jul 2016 - negative     2-dysuria-Aug 2015 - She's had some pelvic pain for a couple of years and underwent a transvaginal ultrasound February 2014. I found the images and these showed normal survey views of the kidney and a normal pelvis. The uterus was absent. Assoc with dysuria, bladder pressure, frequency and abdominal bloating. No gross hematuria. Tx for UTI and vaginitis. No constipation. Voids with a good flow. She does have frequency and urgency. No Gu hx. She has undergone hysterectomy. She was on estrogen replacement in the past but had a concerning breast lump which turned out to be benign but she has been off estrogen replacement since then. NG risk include back surgery and degenerative disc disease. She has no lower extremity weakness or paresthesias and "bowel problems". She feels nothing bulging from the vagina and she does not have to reduce prolapsed to void or have bowel movements. PVR today is 45 mL.   -Oct 2015 - The patient having frequency and nocturia. Sent Myrbetriq 25 mg.   -Nov 2015 myrbetriq and [place]. She can't take Premarin due to fibrocystic breast disease. Myrbetric made her itch all over.  -Mar 2016 - frequency urgency and nocturia. She voids every 1-2 hours and sometimes at night hourly  -Jul 2016 - diet changes - LUTS improved.         Dec 2016 int hx   Patient returns in continuing management of bladder cancer. She has been well. UA clear today. She continues to have some bothersome frequency and urgency.       Past Medical History Problems  1. History of Anxiety (F41.9) 2. History of arthritis  (Z87.39) 3. History of cardiac disorder (Z86.79) 4. History of esophageal reflux (Z87.19) 5. History of hypercholesterolemia (Z86.39) 6. History of hypertension (Z86.79)  Surgical History Problems  1. History of Back Surgery 2. History of Bladder Injection Of Cancer Treatment 3. History of Cath Stent Placement 4. History of Cholecystectomy 5. History of Cystoscopy With Fulguration Medium Lesion (2-5cm) 6. History of Heart Surgery 7. History of Hysterectomy 8. History of Rotator Cuff Repair  Current Meds 1. Aspirin 81 MG TABS;  Therapy: (Recorded:26Aug2015) to Recorded 2. Atorvastatin Calcium 40 MG Oral Tablet;  Therapy: (Recorded:26Aug2015) to Recorded 3. Benazepril HCl - 40 MG Oral Tablet;  Therapy: (Recorded:26Aug2015) to Recorded 4. Carvedilol 25 MG Oral Tablet;  Therapy: (Recorded:26Aug2015) to Recorded 5. Citalopram Hydrobromide 10 MG Oral Tablet;  Therapy: (Recorded:26Aug2015) to Recorded 6. Flonase 50 MCG/ACT SUSP (Fluticasone Propionate);  Therapy: (Recorded:26Aug2015) to Recorded 7. Multi-Vitamin TABS;  Therapy: (Recorded:26Aug2015) to Recorded 8. NexIUM 40 MG Oral Capsule Delayed Release (Esomeprazole Magnesium);  Therapy: (Recorded:26Aug2015) to Recorded  Allergies Medication  1. Biaxin TABS 2. Shellfish-derived Products 3. Sulfa Drugs 4. Amoxicillin TABS 5. losartan 6. Norvasc TABS  Family History Problems  1. Family history of dementia (Z81.8) : Mother 2. Family history of lung disease (Z83.6) : Father 3. Family history of malignant neoplasm of urinary bladder (Z80.52) : Father 4. Family history of Throat cancer : Father  Social History Problems  1. Daily caffeine consumption, 2-3 servings a day 2. Former smoker [phone]) 3. Married 4. No alcohol use 5. Retired  6. Three children  Vitals Vital Signs [Data Includes: Last 1 Day]  Recorded: 02Dec2016 02:08PM  Blood Pressure: 156 / 80 Temperature: 98.1 F Heart Rate: 57  Physical  Exam Constitutional: Well nourished and well developed . No acute distress.   Abdomen: The abdomen is soft and nontender. No masses are palpated. No CVA tenderness. No hernias are palpable. No hepatosplenomegaly noted.   Genitourinary:  Chaperone Present: erica.  Examination of the external genitalia shows normal female external genitalia and no lesions. The urethra is normal in appearance and not tender. There is no urethral mass. Vaginal exam demonstrates no abnormalities. The adnexa are palpably normal. The bladder is non tender and not distended. The anus is normal on inspection. The perineum is normal on inspection.    Results/Data Urine [Data Includes: Last 1 Day]   [id number]  COLOR YELLOW   APPEARANCE CLEAR   SPECIFIC GRAVITY 1.010   pH 5.0   GLUCOSE NEGATIVE   BILIRUBIN NEGATIVE   KETONE NEGATIVE   BLOOD NEGATIVE   PROTEIN NEGATIVE   NITRITE NEGATIVE   LEUKOCYTE ESTERASE TRACE   SQUAMOUS EPITHELIAL/HPF 0-5 HPF  WBC NONE SEEN WBC/HPF  RBC NONE SEEN RBC/HPF  BACTERIA NONE SEEN HPF  CRYSTALS NONE SEEN HPF  CASTS NONE SEEN LPF  Yeast NONE SEEN HPF   Procedure  Procedure: Cystoscopy   Indication: History of Urothelial Carcinoma.  Informed Consent: Risks, benefits, and potential adverse events were discussed and informed consent was obtained from the patient.  Prep: The patient was prepped with betadine.  Antibiotic prophylaxis: Ciprofloxacin.  Procedure Note:  Urethral meatus:. No abnormalities.  Anterior urethra: No abnormalities.  Bladder: Visulization was clear. The ureteral orifices were in the normal anatomic position bilaterally and had clear efflux of urine. A systematic survey of the bladder demonstrated no bladder tumors or stones. The mucosa was smooth without abnormalities. A solitary tumor was visualized in the bladder. A papillary tumor was seen in the bladder. This tumor was located on the anterior aspect of the bladder. --right BN - very small early  papillary recurrence. The patient tolerated the procedure well.  Complications: None.    Assessment Assessed  1. 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 UA With REFLEX; [Do Not Release]; Status:Complete;   [id number] 01:39PM Malignant neoplasm of lateral wall of urinary bladder  2. Follow-up Schedule Surgery Office  Follow-up  Status: Complete  Done: [id number] 3. BUN & CREATININE; Status:Resulted - Requires Verification;   [id number] 03:16PM 4. VENIPUNCTURE; Status:Complete;   [id number]  Discussion/Summary Bladder cancer - cystoscopy today reveals possible small early recurrence right BN. I discussed with the patient the nature risk benefits and alternatives to cystoscopy with bladder biopsy and fulguration. She elects to proceed. I sent a bun and cr and we'll plan for a CT hematuria protocol.   Frequency, urgency - discussed trial of anticholinergic and I gave her some samples of Vesicare.     Signatures Electronically signed by : Festus Aloe, M.D.; Apr 14 2015  8:29PM EST  Add: CT Hematuria was benign.

## 2015-05-16 ENCOUNTER — Ambulatory Visit (HOSPITAL_BASED_OUTPATIENT_CLINIC_OR_DEPARTMENT_OTHER): Payer: Medicare Other | Admitting: Anesthesiology

## 2015-05-16 ENCOUNTER — Encounter (HOSPITAL_BASED_OUTPATIENT_CLINIC_OR_DEPARTMENT_OTHER)
Admission: RE | Disposition: A | Discharge status: Home / Self Care | Payer: Self-pay | Source: Ambulatory Visit | Attending: Urology

## 2015-05-16 ENCOUNTER — Encounter (HOSPITAL_BASED_OUTPATIENT_CLINIC_OR_DEPARTMENT_OTHER): Payer: Self-pay

## 2015-05-16 ENCOUNTER — Ambulatory Visit (HOSPITAL_BASED_OUTPATIENT_CLINIC_OR_DEPARTMENT_OTHER)
Admission: RE | Admit: 2015-05-16 | Discharge: 2015-05-16 | Disposition: A | Discharge status: Home / Self Care | Payer: Medicare Other | Source: Ambulatory Visit | Attending: Urology | Admitting: Urology

## 2015-05-16 DIAGNOSIS — Z87891 Personal history of nicotine dependence: Secondary | ICD-10-CM | POA: Diagnosis not present

## 2015-05-16 DIAGNOSIS — Z90710 Acquired absence of both cervix and uterus: Secondary | ICD-10-CM | POA: Diagnosis not present

## 2015-05-16 DIAGNOSIS — E7800 Pure hypercholesterolemia, unspecified: Secondary | ICD-10-CM | POA: Diagnosis not present

## 2015-05-16 DIAGNOSIS — Z7982 Long term (current) use of aspirin: Secondary | ICD-10-CM | POA: Insufficient documentation

## 2015-05-16 DIAGNOSIS — I10 Essential (primary) hypertension: Secondary | ICD-10-CM | POA: Diagnosis not present

## 2015-05-16 DIAGNOSIS — C672 Malignant neoplasm of lateral wall of bladder: Secondary | ICD-10-CM | POA: Diagnosis present

## 2015-05-16 DIAGNOSIS — C673 Malignant neoplasm of anterior wall of bladder: Secondary | ICD-10-CM | POA: Diagnosis not present

## 2015-05-16 DIAGNOSIS — Z79899 Other long term (current) drug therapy: Secondary | ICD-10-CM | POA: Insufficient documentation

## 2015-05-16 DIAGNOSIS — Z955 Presence of coronary angioplasty implant and graft: Secondary | ICD-10-CM | POA: Insufficient documentation

## 2015-05-16 DIAGNOSIS — Z7951 Long term (current) use of inhaled steroids: Secondary | ICD-10-CM | POA: Diagnosis not present

## 2015-05-16 DIAGNOSIS — K219 Gastro-esophageal reflux disease without esophagitis: Secondary | ICD-10-CM | POA: Insufficient documentation

## 2015-05-16 DIAGNOSIS — F419 Anxiety disorder, unspecified: Secondary | ICD-10-CM | POA: Insufficient documentation

## 2015-05-16 DIAGNOSIS — I2510 Atherosclerotic heart disease of native coronary artery without angina pectoris: Secondary | ICD-10-CM | POA: Insufficient documentation

## 2015-05-16 HISTORY — PX: CYSTOSCOPY WITH BIOPSY: SHX5122

## 2015-05-16 HISTORY — DX: Hemoperitoneum: K66.1

## 2015-05-16 HISTORY — DX: Personal history of malignant neoplasm of bladder: Z85.51

## 2015-05-16 HISTORY — DX: Ventricular premature depolarization: I49.3

## 2015-05-16 HISTORY — DX: Prediabetes: R73.03

## 2015-05-16 HISTORY — DX: Pain in right shoulder: M25.511

## 2015-05-16 HISTORY — DX: Presence of coronary angioplasty implant and graft: Z95.5

## 2015-05-16 LAB — POCT I-STAT 4, (NA,K, GLUC, HGB,HCT)
Glucose, Bld: 130 mg/dL — ABNORMAL HIGH (ref 65–99)
HEMATOCRIT: 40 % (ref 36.0–46.0)
HEMOGLOBIN: 13.6 g/dL (ref 12.0–15.0)
Potassium: 4 mmol/L (ref 3.5–5.1)
SODIUM: 141 mmol/L (ref 135–145)

## 2015-05-16 SURGERY — CYSTOSCOPY, WITH BIOPSY
Anesthesia: General | Site: Bladder

## 2015-05-16 MED ORDER — DEXAMETHASONE SODIUM PHOSPHATE 4 MG/ML IJ SOLN
INTRAMUSCULAR | Status: DC | PRN
  Administered 2015-05-16: 8 mg via INTRAVENOUS

## 2015-05-16 MED ORDER — NITROFURANTOIN MONOHYD MACRO 100 MG PO CAPS: 100.0000 mg | ORAL_CAPSULE | Freq: Every day | ORAL | Status: DC

## 2015-05-16 MED ORDER — PROPOFOL 10 MG/ML IV BOLUS
INTRAVENOUS | Status: AC
  Filled 2015-05-16: qty 20

## 2015-05-16 MED ORDER — CIPROFLOXACIN IN D5W 400 MG/200ML IV SOLN
INTRAVENOUS | Status: AC
  Filled 2015-05-16: qty 200

## 2015-05-16 MED ORDER — ONDANSETRON HCL 4 MG/2ML IJ SOLN
INTRAMUSCULAR | Status: DC | PRN
  Administered 2015-05-16: 4 mg via INTRAVENOUS

## 2015-05-16 MED ORDER — STERILE WATER FOR IRRIGATION IR SOLN
Status: DC | PRN
  Administered 2015-05-16: 3000 mL

## 2015-05-16 MED ORDER — LIDOCAINE HCL (CARDIAC) 20 MG/ML IV SOLN
INTRAVENOUS | Status: DC | PRN
  Administered 2015-05-16: 60 mg via INTRAVENOUS

## 2015-05-16 MED ORDER — FENTANYL CITRATE (PF) 100 MCG/2ML IJ SOLN
INTRAMUSCULAR | Status: AC
  Filled 2015-05-16: qty 2

## 2015-05-16 MED ORDER — CIPROFLOXACIN IN D5W 400 MG/200ML IV SOLN
400.0000 mg | Freq: Two times a day (BID) | INTRAVENOUS | Status: DC
  Administered 2015-05-16: 400 mg via INTRAVENOUS
  Filled 2015-05-16: qty 200

## 2015-05-16 MED ORDER — FENTANYL CITRATE (PF) 100 MCG/2ML IJ SOLN
INTRAMUSCULAR | Status: DC | PRN
  Administered 2015-05-16: 25 ug via INTRAVENOUS
  Administered 2015-05-16: 50 ug via INTRAVENOUS
  Administered 2015-05-16: 25 ug via INTRAVENOUS

## 2015-05-16 MED ORDER — LACTATED RINGERS IV SOLN
INTRAVENOUS | Status: DC
  Administered 2015-05-16: 07:00:00 via INTRAVENOUS
  Filled 2015-05-16: qty 1000

## 2015-05-16 MED ORDER — TRAMADOL HCL 50 MG PO TABS: 50.0000 mg | ORAL_TABLET | Freq: Four times a day (QID) | ORAL | Status: DC | PRN

## 2015-05-16 MED ORDER — FENTANYL CITRATE (PF) 100 MCG/2ML IJ SOLN
25.0000 ug | INTRAMUSCULAR | Status: DC | PRN
  Filled 2015-05-16: qty 1

## 2015-05-16 MED ORDER — MIDAZOLAM HCL 2 MG/2ML IJ SOLN
INTRAMUSCULAR | Status: AC
  Filled 2015-05-16: qty 2

## 2015-05-16 MED ORDER — PHENAZOPYRIDINE HCL 100 MG PO TABS: 100.0000 mg | ORAL_TABLET | Freq: Three times a day (TID) | ORAL | Status: DC | PRN

## 2015-05-16 MED ORDER — DEXAMETHASONE SODIUM PHOSPHATE 10 MG/ML IJ SOLN
INTRAMUSCULAR | Status: AC
  Filled 2015-05-16: qty 1

## 2015-05-16 MED ORDER — PROPOFOL 10 MG/ML IV BOLUS
INTRAVENOUS | Status: DC | PRN
  Administered 2015-05-16: 160 mg via INTRAVENOUS

## 2015-05-16 MED ORDER — LIDOCAINE HCL (CARDIAC) 20 MG/ML IV SOLN
INTRAVENOUS | Status: AC
  Filled 2015-05-16: qty 5

## 2015-05-16 MED ORDER — ONDANSETRON HCL 4 MG/2ML IJ SOLN
INTRAMUSCULAR | Status: AC
  Filled 2015-05-16: qty 2

## 2015-05-16 SURGICAL SUPPLY — 18 items
BAG DRAIN URO-CYSTO SKYTR STRL (DRAIN) ×3 IMPLANT
CANISTER SUCT LVC 12 LTR MEDI- (MISCELLANEOUS) IMPLANT
CLOTH BEACON ORANGE TIMEOUT ST (SAFETY) ×3 IMPLANT
ELECT REM PT RETURN 9FT ADLT (ELECTROSURGICAL) ×3
ELECTRODE REM PT RTRN 9FT ADLT (ELECTROSURGICAL) ×1 IMPLANT
GLOVE BIO SURGEON STRL SZ7.5 (GLOVE) ×3 IMPLANT
GLOVE BIOGEL PI IND STRL 7.5 (GLOVE) ×2 IMPLANT
GLOVE BIOGEL PI INDICATOR 7.5 (GLOVE) ×4
GOWN STRL REUS W/ TWL XL LVL3 (GOWN DISPOSABLE) ×1 IMPLANT
GOWN STRL REUS W/TWL XL LVL3 (GOWN DISPOSABLE) ×5 IMPLANT
KIT ROOM TURNOVER WOR (KITS) ×3 IMPLANT
MANIFOLD NEPTUNE II (INSTRUMENTS) ×3 IMPLANT
NEEDLE HYPO 22GX1.5 SAFETY (NEEDLE) IMPLANT
NS IRRIG 500ML POUR BTL (IV SOLUTION) IMPLANT
PACK CYSTO (CUSTOM PROCEDURE TRAY) ×3 IMPLANT
TUBE CONNECTING 12'X1/4 (SUCTIONS)
TUBE CONNECTING 12X1/4 (SUCTIONS) IMPLANT
WATER STERILE IRR 3000ML UROMA (IV SOLUTION) ×3 IMPLANT

## 2015-05-16 NOTE — Anesthesia Postprocedure Evaluation (Signed)
Anesthesia Post Note  Patient: Meagan Osborne  Procedure(s) Performed: Procedure(s) (LRB): CYSTOSCOPY WITH BIOPSY AND FULGERATION  (N/A)  Patient location during evaluation: PACU Anesthesia Type: General Level of consciousness: awake and alert Pain management: pain level controlled Vital Signs Assessment: post-procedure vital signs reviewed and stable Respiratory status: spontaneous breathing, nonlabored ventilation, respiratory function stable and patient connected to nasal cannula oxygen Cardiovascular status: blood pressure returned to baseline and stable Postop Assessment: no signs of nausea or vomiting Anesthetic complications: no    Last Vitals:  Filed Vitals:   05/16/15 0900 05/16/15 0925  BP: 131/59 125/52  Pulse: 62 63  Temp:  36.7 C  Resp: 14 14    Last Pain:  Filed Vitals:   05/16/15 0925  PainSc: 0-No pain                 Dusten Ellinwood L

## 2015-05-16 NOTE — Transfer of Care (Signed)
Immediate Anesthesia Transfer of Care Note  Patient: Meagan Osborne  Procedure(s) Performed: Procedure(s) (LRB): CYSTOSCOPY WITH BIOPSY AND FULGERATION  (N/A)  Patient Location: PACU  Anesthesia Type: General  Level of Consciousness: awake, oriented, sedated and patient cooperative  Airway & Oxygen Therapy: Patient Spontanous Breathing and Patient connected to face mask oxygen  Post-op Assessment: Report given to PACU RN and Post -op Vital signs reviewed and stable  Post vital signs: Reviewed and stable  Complications: No apparent anesthesia complications

## 2015-05-16 NOTE — Interval H&P Note (Signed)
History and Physical Interval Note:  05/16/2015 7:33 AM  Meagan Osborne  has presented today for surgery, with the diagnosis of BLADDER NEOPLASM   The various methods of treatment have been discussed with the patient and family. After consideration of risks, benefits and other options for treatment, the patient has consented to  Procedure(s): [place]  (N/A) as a surgical intervention .  The patient's history has been reviewed, patient examined, no change in status, stable for surgery.  I have reviewed the patient's chart and labs.  Questions were answered to the patient's satisfaction.  She has been well. No dysuria or gross hematuria.    Meagan Osborne

## 2015-05-16 NOTE — Anesthesia Procedure Notes (Signed)
Procedure Name: LMA Insertion Date/Time: 05/16/2015 7:35 AM Performed by: Denna Haggard D Pre-anesthesia Checklist: Patient identified, Emergency Drugs available, Suction available and Patient being monitored Patient Re-evaluated:Patient Re-evaluated prior to inductionOxygen Delivery Method: Circle System Utilized Preoxygenation: Pre-oxygenation with 100% oxygen Intubation Type: IV induction Ventilation: Mask ventilation without difficulty LMA: LMA inserted LMA Size: 4.0 Number of attempts: 1 Airway Equipment and Method: Bite block Placement Confirmation: positive ETCO2 Tube secured with: Tape Dental Injury: Teeth and Oropharynx as per pre-operative assessment

## 2015-05-16 NOTE — Op Note (Signed)
Preoperative diagnosis: Malignant neoplasm of bladder lateral, neoplasm of uncertain significance anterior Postoperative diagnosis: Same  Procedure: Cystoscopy with bladder biopsy and fulguration  Surgeon: Junious Silk  Anesthesia: Gen.  Findings: Very small papillary tumor right anterior bladder. Biopsied with [place] and fulgurated. Remainder bladder shows a smaller capacity and some glomerulations. Patient might benefit from urodynamics to assess bladder capacity instability given her history of frequency and urgency.  Description of procedure: After consent was obtained patient brought to the operating room. After adequate anesthesia she is placed in lithotomy position and prepped and draped in the usual sterile fashion. Exam under anesthesia revealed a normal bladder and urethra without mass on vaginal exam. Evidence of prior hysterectomy. The cystoscope was passed per urethra and the bladder carefully inspected with the 30 and 70 lens. There was good clear efflux from both ureteral orifices. The right bladder tumor was photographed and biopsied with the flexible biopsy forceps in the area fulgurated. Hemostasis was excellent. There was some glomerulations posteriorly and a 1-2 mm convergence inferiorly to lightly fulgurated. The bladder was drained and the scope removed. She was awakened and taken to recovery room in stable condition.  Complications: None  Blood loss: Minimal  Drains: None  Specimens: Anterior bladder wall biopsy to pathology  Disposition: Patient stable to PACU

## 2015-05-16 NOTE — Discharge Instructions (Signed)

## 2015-05-19 ENCOUNTER — Encounter (HOSPITAL_BASED_OUTPATIENT_CLINIC_OR_DEPARTMENT_OTHER): Payer: Self-pay | Admitting: Urology

## 2015-10-07 ENCOUNTER — Telehealth: Payer: Self-pay | Admitting: Family Medicine

## 2015-10-07 NOTE — Telephone Encounter (Signed)
This is the sister of Meagan Osborne and she is looking for a new doctor.  She has been seeing Dr Netty Starring at [facility] and would like to switch doctors.  Rx-aspirin EC 81 MG tablet, atorvastatin (LIPITOR) 40 MG tablet, benazepril (LOTENSIN) 40 MG tablet, carvedilol (COREG) 25 MG tablet, citalopram (CELEXA) 10 MG tablet, fluticasone (FLONASE) 50 MCG/ACT nasal spray, Multiple Vitamins-Minerals (MULTIVITAMINS THER. W/MINERALS) TABS, esomeprazole (NEXIUM) 40 MG capsule and nitroGLYCERIN (NITROSTAT) 0.4 MG SL tablet.  Medicare and [employer].  Will you take pt as a new patient?  [id number]

## 2015-10-07 NOTE — Telephone Encounter (Signed)
Sorry--not taking new pts.

## 2015-10-07 NOTE — Telephone Encounter (Signed)
Please review-aa 

## 2015-10-07 NOTE — Telephone Encounter (Signed)
Pt informed, she was not happy but explained to her why.

## 2017-03-08 ENCOUNTER — Encounter: Payer: Self-pay | Admitting: *Deleted

## 2017-03-09 ENCOUNTER — Encounter: Payer: Self-pay | Admitting: *Deleted

## 2017-03-09 ENCOUNTER — Ambulatory Visit: Payer: Medicare Other | Admitting: Anesthesiology

## 2017-03-09 ENCOUNTER — Ambulatory Visit
Admission: RE | Admit: 2017-03-09 | Discharge: 2017-03-09 | Disposition: A | Discharge status: Home / Self Care | Payer: Medicare Other | Source: Ambulatory Visit | Attending: Unknown Physician Specialty | Admitting: Unknown Physician Specialty

## 2017-03-09 ENCOUNTER — Encounter
Admission: RE | Disposition: A | Discharge status: Home / Self Care | Payer: Self-pay | Source: Ambulatory Visit | Attending: Unknown Physician Specialty

## 2017-03-09 DIAGNOSIS — Z888 Allergy status to other drugs, medicaments and biological substances status: Secondary | ICD-10-CM | POA: Diagnosis not present

## 2017-03-09 DIAGNOSIS — F419 Anxiety disorder, unspecified: Secondary | ICD-10-CM | POA: Insufficient documentation

## 2017-03-09 DIAGNOSIS — Z887 Allergy status to serum and vaccine status: Secondary | ICD-10-CM | POA: Insufficient documentation

## 2017-03-09 DIAGNOSIS — Z8719 Personal history of other diseases of the digestive system: Secondary | ICD-10-CM | POA: Insufficient documentation

## 2017-03-09 DIAGNOSIS — Z8551 Personal history of malignant neoplasm of bladder: Secondary | ICD-10-CM | POA: Insufficient documentation

## 2017-03-09 DIAGNOSIS — Z91013 Allergy to seafood: Secondary | ICD-10-CM | POA: Insufficient documentation

## 2017-03-09 DIAGNOSIS — Z955 Presence of coronary angioplasty implant and graft: Secondary | ICD-10-CM | POA: Diagnosis not present

## 2017-03-09 DIAGNOSIS — K219 Gastro-esophageal reflux disease without esophagitis: Secondary | ICD-10-CM | POA: Insufficient documentation

## 2017-03-09 DIAGNOSIS — Z87891 Personal history of nicotine dependence: Secondary | ICD-10-CM | POA: Insufficient documentation

## 2017-03-09 DIAGNOSIS — Z7984 Long term (current) use of oral hypoglycemic drugs: Secondary | ICD-10-CM | POA: Insufficient documentation

## 2017-03-09 DIAGNOSIS — I2510 Atherosclerotic heart disease of native coronary artery without angina pectoris: Secondary | ICD-10-CM | POA: Insufficient documentation

## 2017-03-09 DIAGNOSIS — Z882 Allergy status to sulfonamides status: Secondary | ICD-10-CM | POA: Insufficient documentation

## 2017-03-09 DIAGNOSIS — E785 Hyperlipidemia, unspecified: Secondary | ICD-10-CM | POA: Diagnosis not present

## 2017-03-09 DIAGNOSIS — Z7982 Long term (current) use of aspirin: Secondary | ICD-10-CM | POA: Insufficient documentation

## 2017-03-09 DIAGNOSIS — I10 Essential (primary) hypertension: Secondary | ICD-10-CM | POA: Insufficient documentation

## 2017-03-09 DIAGNOSIS — Z8371 Family history of colonic polyps: Secondary | ICD-10-CM | POA: Diagnosis not present

## 2017-03-09 DIAGNOSIS — I252 Old myocardial infarction: Secondary | ICD-10-CM | POA: Insufficient documentation

## 2017-03-09 DIAGNOSIS — I493 Ventricular premature depolarization: Secondary | ICD-10-CM | POA: Diagnosis not present

## 2017-03-09 DIAGNOSIS — M810 Age-related osteoporosis without current pathological fracture: Secondary | ICD-10-CM | POA: Insufficient documentation

## 2017-03-09 DIAGNOSIS — Z880 Allergy status to penicillin: Secondary | ICD-10-CM | POA: Insufficient documentation

## 2017-03-09 DIAGNOSIS — Z1211 Encounter for screening for malignant neoplasm of colon: Secondary | ICD-10-CM | POA: Diagnosis present

## 2017-03-09 DIAGNOSIS — E119 Type 2 diabetes mellitus without complications: Secondary | ICD-10-CM | POA: Diagnosis not present

## 2017-03-09 DIAGNOSIS — Z881 Allergy status to other antibiotic agents status: Secondary | ICD-10-CM | POA: Insufficient documentation

## 2017-03-09 DIAGNOSIS — K5730 Diverticulosis of large intestine without perforation or abscess without bleeding: Secondary | ICD-10-CM | POA: Diagnosis not present

## 2017-03-09 DIAGNOSIS — Z79899 Other long term (current) drug therapy: Secondary | ICD-10-CM | POA: Diagnosis not present

## 2017-03-09 DIAGNOSIS — K640 First degree hemorrhoids: Secondary | ICD-10-CM | POA: Insufficient documentation

## 2017-03-09 DIAGNOSIS — L409 Psoriasis, unspecified: Secondary | ICD-10-CM | POA: Diagnosis not present

## 2017-03-09 DIAGNOSIS — M1990 Unspecified osteoarthritis, unspecified site: Secondary | ICD-10-CM | POA: Insufficient documentation

## 2017-03-09 DIAGNOSIS — Z885 Allergy status to narcotic agent status: Secondary | ICD-10-CM | POA: Insufficient documentation

## 2017-03-09 HISTORY — DX: Anxiety disorder, unspecified: F41.9

## 2017-03-09 HISTORY — PX: COLONOSCOPY WITH PROPOFOL: SHX5780

## 2017-03-09 SURGERY — COLONOSCOPY WITH PROPOFOL
Anesthesia: General

## 2017-03-09 MED ORDER — PROPOFOL 500 MG/50ML IV EMUL
INTRAVENOUS | Status: AC
  Filled 2017-03-09: qty 50

## 2017-03-09 MED ORDER — ONDANSETRON HCL 4 MG/2ML IJ SOLN
INTRAMUSCULAR | Status: DC | PRN
  Administered 2017-03-09: 4 mg via INTRAVENOUS

## 2017-03-09 MED ORDER — SODIUM CHLORIDE 0.9 % IV SOLN
INTRAVENOUS | Status: DC
  Administered 2017-03-09: 09:00:00 via INTRAVENOUS

## 2017-03-09 MED ORDER — FENTANYL CITRATE (PF) 100 MCG/2ML IJ SOLN
INTRAMUSCULAR | Status: DC | PRN
  Administered 2017-03-09: 50 ug via INTRAVENOUS
  Administered 2017-03-09 (×2): 25 ug via INTRAVENOUS

## 2017-03-09 MED ORDER — SODIUM CHLORIDE 0.9 % IV SOLN: INTRAVENOUS | Status: DC

## 2017-03-09 MED ORDER — ONDANSETRON HCL 4 MG/2ML IJ SOLN
INTRAMUSCULAR | Status: AC
  Filled 2017-03-09: qty 2

## 2017-03-09 MED ORDER — PROPOFOL 500 MG/50ML IV EMUL
INTRAVENOUS | Status: DC | PRN
  Administered 2017-03-09: 100 ug/kg/min via INTRAVENOUS

## 2017-03-09 MED ORDER — FENTANYL CITRATE (PF) 100 MCG/2ML IJ SOLN
INTRAMUSCULAR | Status: AC
  Filled 2017-03-09: qty 2

## 2017-03-09 MED ORDER — MIDAZOLAM HCL 2 MG/2ML IJ SOLN
INTRAMUSCULAR | Status: AC
  Filled 2017-03-09: qty 2

## 2017-03-09 MED ORDER — MIDAZOLAM HCL 2 MG/2ML IJ SOLN
INTRAMUSCULAR | Status: DC | PRN
  Administered 2017-03-09: 2 mg via INTRAVENOUS

## 2017-03-09 NOTE — Anesthesia Procedure Notes (Signed)
Performed by: COOK-MARTIN, Aiya Keach Pre-anesthesia Checklist: Patient identified, Emergency Drugs available, Suction available, Patient being monitored and Timeout performed Patient Re-evaluated:Patient Re-evaluated prior to induction Oxygen Delivery Method: Nasal cannula Preoxygenation: Pre-oxygenation with 100% oxygen Induction Type: IV induction Placement Confirmation: CO2 detector and positive ETCO2       

## 2017-03-09 NOTE — H&P (Signed)
Primary Care Physician:  Dion Body, MD Primary Gastroenterologist:  Dr. Vira Agar  Pre-Procedure History & Physical: HPI:  Meagan Osborne is a 71 y.o. female is here for an colonoscopy.   Past Medical History:  Diagnosis Date  . Anxiety   . Arthritis   . Bladder neoplasm   . Borderline diabetes   . Chronic back pain   . Coronary artery disease    S/P  DES X2 TO MLAD  . Diabetes mellitus without complication ([place])   . Difficult intubation    pt states was told she has a crooked airway-- REFER TO ANESTHESIA  RECORD FROM 06-07-2011 SURG. AT [facility]-- GLIDESCOPE USED WITHOUT ISSUES  . Diverticulosis   . GERD (gastroesophageal reflux disease)   . H/O hiatal hernia   . 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 Hyperlipidemia   . Hypertension   . Multilevel spondylosis   . Nocturia   . Osteoporosis   . PONV (postoperative nausea and vomiting)   . Psoriasis   . PVC's (premature ventricular contractions)   . Retroperitoneal hematoma    PER CT  . Right shoulder pain    S/P   ROTATOR CUFF REPAIR 10/  2016  . S/P drug eluting coronary stent placement   . Urinary frequency     Past Surgical History:  Procedure Laterality Date  . BELPHAROPTOSIS REPAIR Bilateral 2014  . CARDIAC CATHETERIZATION  09-17-2008   [facility]   20% mRCA, 20% ostial RCA, 10% OM2,  patent previous stents, ef 68%  . CARDIAC CATHETERIZATION  05-14-2010  [facility]   no change since previous cath  . CAROTID ENDARTERECTOMY Right 07-10-2009  . CATARACT EXTRACTION W/ INTRAOCULAR LENS  IMPLANT, BILATERAL  2015  . CHOLECYSTECTOMY  1990  . COLONOSCOPY    . CORONARY ANGIOPLASTY WITH STENT PLACEMENT  10/ 2008  [facility]   DES X1 to mLAD  . CORONARY ANGIOPLASTY WITH STENT PLACEMENT  09/ 2009  [facility]   DES X1  tp mLAD  . CYSTOSCOPY WITH BIOPSY N/A 05/16/2015   Procedure: CYSTOSCOPY WITH BIOPSY AND FULGERATION ;  Surgeon: Festus Aloe, MD;  Location: [facility];  Service: Urology;  Laterality:  N/A;  . HAND SURGERY Right 2005  . LUMBAR LAMINECTOMY/DECOMPRESSION MICRODISCECTOMY  06/07/2011   Procedure: LUMBAR LAMINECTOMY/DECOMPRESSION MICRODISCECTOMY 2 LEVELS;  Surgeon: Elaina Hoops, MD;  Location: [facility];  Service: Neurosurgery;  Laterality: Left;   Lumbar Two-Three, Lumbar Three-Four Decompressive Lumbar Laminectomy  . SHOULDER ARTHROSCOPY WITH OPEN ROTATOR CUFF REPAIR Right 02/20/2015   Procedure: SHOULDER ARTHROSCOPY WITH OPEN ROTATOR CUFF REPAIR and decompression;  Surgeon: Corky Mull, MD;  Location: [facility];  Service: Orthopedics;  Laterality: Right;  . TONSILLECTOMY AND ADENOIDECTOMY  1956  . TRANSURETHRAL RESECTION OF BLADDER TUMOR WITH MITOMYCIN-C N/A 02/05/2014   Procedure: TRANSURETHRAL RESECTION OF BLADDER TUMOR ;  Surgeon: Festus Aloe, MD;  Location[facility];  Service: Urology;  Laterality: N/A;  . [place]    Prior to Admission medications   Medication Sig Start Date End Date Taking? Authorizing Provider  acetaminophen (TYLENOL) 500 MG tablet Take 1,000 mg by mouth every 6 (six) hours as needed.   Yes [provider]  aspirin EC 81 MG tablet Take 81 mg by mouth daily.   Yes [provider]  atorvastatin (LIPITOR) 40 MG tablet Take 40 mg by mouth every evening.    Yes [provider]  benazepril (LOTENSIN) 40  MG tablet Take 40 mg by mouth every morning.    Yes [provider]  carvedilol (COREG) 25 MG tablet Take 25 mg by mouth 2 (two) times daily with a meal.   Yes [provider]  esomeprazole (NEXIUM) 40 MG capsule Take 40 mg by mouth at bedtime.    Yes [provider]  Multiple Vitamins-Minerals (MULTIVITAMINS THER. W/MINERALS) TABS Take 1 tablet by mouth daily.   Yes [provider]  citalopram (CELEXA) 10 MG tablet Take 10 mg by mouth every evening.    [provider]  fluticasone (FLONASE) 50 MCG/ACT nasal spray Place 2 sprays into both nostrils  daily.    [provider]  nitrofurantoin, macrocrystal-monohydrate, (MACROBID) 100 MG capsule Take 1 capsule (100 mg total) by mouth at bedtime. Patient not taking: Reported on 03/09/2017 05/16/15   Festus Aloe, MD  nitroGLYCERIN (NITROSTAT) 0.4 MG SL tablet Place 0.4 mg under the tongue every 5 (five) minutes as needed. For chest pain    [provider]  phenazopyridine (PYRIDIUM) 100 MG tablet Take 1 tablet (100 mg total) by mouth 3 (three) times daily as needed for pain. Patient not taking: Reported on 03/09/2017 05/16/15   Festus Aloe, MD  traMADol (ULTRAM) 50 MG tablet Take 1 tablet (50 mg total) by mouth every 6 (six) hours as needed for moderate pain. Patient not taking: Reported on 03/09/2017 05/16/15   Festus Aloe, MD    Allergies as of 12/28/2016 - Review Complete 05/16/2015  Allergen Reaction Noted  . Shellfish allergy Anaphylaxis and Hives 05/26/2011  . Aciphex [rabeprazole sodium] Other (See Comments) 02/13/2015  . Biaxin [clarithromycin] Hives and Swelling 01/31/2014  . Codeine Other (See Comments) 02/13/2015  . Evista [raloxifene]  02/13/2015  . Levaquin [levofloxacin in d5w] Other (See Comments) 02/13/2015  . Losartan Swelling 02/13/2015  . Myrbetriq [mirabegron] Other (See Comments) 02/13/2015  . Norvasc [amlodipine besylate] Swelling 02/13/2015  . Prevacid [lansoprazole] Other (See Comments) 02/13/2015  . Roxicodone [oxycodone] Nausea And Vomiting 05/13/2015  . Sulfa antibiotics Hives 05/26/2011  . Tetanus toxoids Other (See Comments) 02/13/2015  . Zoloft [sertraline hcl] Other (See Comments) 02/13/2015  . Amoxicillin Rash 02/05/2014    Family History  Problem Relation Age of Onset  . Anesthesia problems Neg Hx   . Hypotension Neg Hx   . Malignant hyperthermia Neg Hx   . Pseudochol deficiency Neg Hx     Social History   Social History  . Marital status: Married    Spouse name: N/A  . Number of children: N/A  . Years of  education: N/A   Occupational History  . Not on file.   Social History Main Topics  . Smoking status: Former Smoker    Packs/day: 0.25    Years: 5.00    Types: Cigarettes    Quit date: 01/31/1999  . Smokeless tobacco: Never Used  . Alcohol use No  . Drug use: No  . Sexual activity: Not on file   Other Topics Concern  . Not on file   Social History Narrative  . No narrative on file    Review of Systems: See HPI, otherwise negative ROS  Physical Exam: BP (!) 133/35   Pulse 95   Temp 98.6 F (37 C) (Tympanic)   Resp 20   Ht 5\' 1"  (1.549 m)   Wt 70.3 kg (155 lb)   SpO2 96%   BMI 29.29 kg/m  General:   Alert,  pleasant and cooperative in NAD Head:  Normocephalic and  atraumatic. Neck:  Supple; no masses or thyromegaly. Lungs:  Clear throughout to auscultation.    Heart:  Regular rate and rhythm. Abdomen:  Soft, nontender and nondistended. Normal bowel sounds, without guarding, and without rebound.   Neurologic:  Alert and  oriented x4;  grossly normal neurologically.  Impression/Plan: Meagan Osborne is here for an colonoscopy to be performed for Screening due to family history of colon polyps   Risks, benefits, limitations, and alternatives regarding  colonoscopy have been reviewed with the patient.  Questions have been answered.  All parties agreeable.   Gaylyn Cheers, MD  03/09/2017, 9:47 AM

## 2017-03-09 NOTE — Anesthesia Preprocedure Evaluation (Signed)
Anesthesia Evaluation  Patient identified by MRN, date of birth, ID band Patient awake    Reviewed: Allergy & Precautions, NPO status , reviewed documented beta blocker date and time   History of Anesthesia Complications (+) PONV and DIFFICULT AIRWAY  Airway Mallampati: III  TM Distance: <3 FB     Dental  (+) Teeth Intact   Pulmonary former smoker,           Cardiovascular Exercise Tolerance: Good hypertension, Pt. on medications + Past MI and + Cardiac Stents   Rate:Tachycardia     Neuro/Psych Anxiety    GI/Hepatic Neg liver ROS, hiatal hernia, GERD  Medicated,  Endo/Other  diabetes, Type 2, Oral Hypoglycemic Agents  Renal/GU      Musculoskeletal   Abdominal (+) + obese,   Peds  Hematology   Anesthesia Other Findings   Reproductive/Obstetrics                             Anesthesia Physical Anesthesia Plan  ASA: III  Anesthesia Plan: General   Post-op Pain Management:    Induction: Intravenous  PONV Risk Score and Plan: 1 and Ondansetron  Airway Management Planned: Natural Airway and Nasal Cannula  Additional Equipment:   Intra-op Plan:   Post-operative Plan:   Informed Consent: I have reviewed the patients History and Physical, chart, labs and discussed the procedure including the risks, benefits and alternatives for the proposed anesthesia with the patient or authorized representative who has indicated his/her understanding and acceptance.     Plan Discussed with: CRNA  Anesthesia Plan Comments:         Anesthesia Quick Evaluation

## 2017-03-09 NOTE — Transfer of Care (Signed)
Immediate Anesthesia Transfer of Care Note  Patient: Meagan Osborne  Procedure(s) Performed: COLONOSCOPY WITH PROPOFOL (N/A )  Patient Location: PACU  Anesthesia Type:General  Level of Consciousness: awake and sedated  Airway & Oxygen Therapy: Patient Spontanous Breathing and Patient connected to nasal cannula oxygen  Post-op Assessment: Report given to RN and Post -op Vital signs reviewed and stable  Post vital signs: Reviewed and stable  Last Vitals:  Vitals:   03/09/17 0855  BP: (!) 133/35  Pulse: 95  Resp: 20  Temp: 37 C  SpO2: 96%    Last Pain:  Vitals:   03/09/17 0855  TempSrc: Tympanic         Complications: No apparent anesthesia complications

## 2017-03-09 NOTE — Anesthesia Post-op Follow-up Note (Signed)
Anesthesia QCDR form completed.        

## 2017-03-09 NOTE — Op Note (Signed)
[facility] Gastroenterology Patient Name: Meagan Osborne Procedure Date: 03/09/2017 9:46 AM MRN: [id number] Account #: [id number] Date of Birth: 07-01-1945 Admit Type: Outpatient Age: 71 Room: [facility] ENDO ROOM 3 Gender: Female Note Status: Finalized Procedure:            Colonoscopy Indications:          Colon cancer screening in patient at increased risk:                        Family history of 1st-degree relative with colon polyps Providers:            Manya Silvas, MD Referring MD:         Dion Body (Referring MD) Medicines:            Propofol per Anesthesia Complications:        No immediate complications. Procedure:            Pre-Anesthesia Assessment:                       - After reviewing the risks and benefits, the patient                        was deemed in satisfactory condition to undergo the                        procedure.                       After obtaining informed consent, the colonoscope was                        passed under direct vision. Throughout the procedure,                        the patient's blood pressure, pulse, and oxygen                        saturations were monitored continuously. The                        Colonoscope was introduced through the anus and                        advanced to the the cecum, identified by appendiceal                        orifice and ileocecal valve. The colonoscopy was                        performed without difficulty. The patient tolerated the                        procedure well. The quality of the bowel preparation                        was excellent. Findings:      Many small-medium-mouthed diverticula were found in the sigmoid colon       and descending colon.      Internal hemorrhoids were found during endoscopy. The hemorrhoids were       small and Grade I (internal hemorrhoids  that do not prolapse).      The exam was otherwise without abnormality. Impression:            - Diverticulosis in the sigmoid colon and in the                        descending colon.                       - Internal hemorrhoids.                       - The examination was otherwise normal.                       - No specimens collected. Recommendation:       - Repeat colonoscopy in 10 years for surveillance. Or                        not at all. Manya Silvas, MD 03/09/2017 10:21:27 AM This report has been signed electronically. Number of Addenda: 0 Note Initiated On: 03/09/2017 9:46 AM Scope Withdrawal Time: 0 hours 14 minutes 22 seconds  Total Procedure Duration: 0 hours 23 minutes 46 seconds       [facility]

## 2017-03-09 NOTE — Anesthesia Postprocedure Evaluation (Signed)
Anesthesia Post Note  Patient: [name]  Procedure(s) Performed: COLONOSCOPY WITH PROPOFOL (N/A )  Patient location during evaluation: PACU Anesthesia Type: General Level of consciousness: awake Pain management: pain level controlled Vital Signs Assessment: post-procedure vital signs reviewed and stable Cardiovascular status: stable Anesthetic complications: no     Last Vitals:  Vitals:   03/09/17 0855 03/09/17 1020  BP: (!) 133/35   Pulse: 95 62  Resp: 20 14  Temp: 37 C (!) 36 C  SpO2: 96% 93%    Last Pain:  Vitals:   03/09/17 0855  TempSrc: Tympanic                 VAN STAVEREN,Shivaun Bilello

## 2017-03-11 ENCOUNTER — Encounter: Payer: Self-pay | Admitting: Unknown Physician Specialty

## 2018-06-23 ENCOUNTER — Other Ambulatory Visit: Payer: Self-pay | Admitting: Surgery

## 2018-06-23 ENCOUNTER — Ambulatory Visit
Admission: RE | Admit: 2018-06-23 | Discharge: 2018-06-23 | Disposition: A | Discharge status: Home / Self Care | Payer: Medicare Other | Source: Ambulatory Visit | Attending: Surgery | Admitting: Surgery

## 2018-06-23 DIAGNOSIS — S32010A Wedge compression fracture of first lumbar vertebra, initial encounter for closed fracture: Secondary | ICD-10-CM | POA: Insufficient documentation

## 2018-06-23 DIAGNOSIS — M47816 Spondylosis without myelopathy or radiculopathy, lumbar region: Secondary | ICD-10-CM | POA: Diagnosis present

## 2019-06-05 ENCOUNTER — Encounter (INDEPENDENT_AMBULATORY_CARE_PROVIDER_SITE_OTHER): Payer: Self-pay

## 2019-06-05 ENCOUNTER — Other Ambulatory Visit: Payer: Self-pay | Admitting: Physician Assistant

## 2019-06-05 ENCOUNTER — Other Ambulatory Visit: Payer: Self-pay

## 2019-06-05 ENCOUNTER — Ambulatory Visit
Admission: RE | Admit: 2019-06-05 | Discharge: 2019-06-05 | Disposition: A | Discharge status: Home / Self Care | Payer: Medicare Other | Source: Ambulatory Visit | Attending: Physician Assistant | Admitting: Physician Assistant

## 2019-06-05 DIAGNOSIS — M7989 Other specified soft tissue disorders: Secondary | ICD-10-CM

## 2019-06-17 ENCOUNTER — Ambulatory Visit: Payer: Medicare Other | Attending: Internal Medicine

## 2019-06-17 DIAGNOSIS — Z23 Encounter for immunization: Secondary | ICD-10-CM | POA: Insufficient documentation

## 2019-06-17 NOTE — Progress Notes (Signed)
   Covid-19 Vaccination Clinic  Name:  Meagan Osborne    MRN: [id number] DOB: August 15, 1945  06/17/2019  [name] was observed post Covid-19 immunization for 15 minutes without incidence. She was provided with Vaccine Information Sheet and instruction to access the V-Safe system.   [name] was instructed to call 911 with any severe reactions post vaccine: Marland Kitchen Difficulty breathing  . Swelling of your face and throat  . A fast heartbeat  . A bad rash all over your body  . Dizziness and weakness    Immunizations Administered    Name Date Dose VIS Date Route   Pfizer COVID-19 Vaccine 06/17/2019 12:19 PM 0.3 mL 04/20/2019 Intramuscular   Manufacturer: [place]   Lot: [id number]   [place]: [id number]

## 2019-06-18 ENCOUNTER — Other Ambulatory Visit: Payer: Medicare Other

## 2019-07-11 ENCOUNTER — Ambulatory Visit: Payer: Medicare Other | Attending: Internal Medicine

## 2019-07-11 DIAGNOSIS — Z23 Encounter for immunization: Secondary | ICD-10-CM

## 2019-07-11 NOTE — Progress Notes (Signed)
   Covid-19 Vaccination Clinic  Name:  Meagan Osborne    MRN: [id number] DOB: 02-25-1946  07/11/2019  Meagan Osborne was observed post Covid-19 immunization for 15 minutes without incident. She was provided with Vaccine Information Sheet and instruction to access the V-Safe system.   Meagan Osborne was instructed to call 911 with any severe reactions post vaccine: Marland Kitchen Difficulty breathing  . Swelling of face and throat  . A fast heartbeat  . A bad rash all over body  . Dizziness and weakness   Immunizations Administered    Name Date Dose VIS Date Route   Pfizer COVID-19 Vaccine 07/11/2019 11:24 AM 0.3 mL 04/20/2019 Intramuscular   Manufacturer: [place]   Lot: [id number]   [place]: [id number]

## 2019-10-07 ENCOUNTER — Ambulatory Visit (INDEPENDENT_AMBULATORY_CARE_PROVIDER_SITE_OTHER): Payer: Medicare Other

## 2019-10-07 ENCOUNTER — Ambulatory Visit
Admission: EM | Admit: 2019-10-07 | Discharge: 2019-10-07 | Disposition: A | Discharge status: Home / Self Care | Payer: Medicare Other | Attending: Family Medicine | Admitting: Family Medicine

## 2019-10-07 ENCOUNTER — Other Ambulatory Visit: Payer: Self-pay

## 2019-10-07 ENCOUNTER — Encounter: Payer: Self-pay | Admitting: Emergency Medicine

## 2019-10-07 DIAGNOSIS — M25512 Pain in left shoulder: Secondary | ICD-10-CM

## 2019-10-07 DIAGNOSIS — S52124A Nondisplaced fracture of head of right radius, initial encounter for closed fracture: Secondary | ICD-10-CM

## 2019-10-07 MED ORDER — TRAMADOL HCL 50 MG PO TABS: 50.0000 mg | ORAL_TABLET | Freq: Three times a day (TID) | ORAL | 0 refills | Status: DC | PRN

## 2019-10-07 NOTE — ED Provider Notes (Addendum)
[facility]    CSN: [id number] Arrival date & time: 10/07/19  1427      History   Chief Complaint Chief Complaint  Patient presents with  . Shoulder Pain  . Fall   HPI  74 year old female presents with the above complaint.  Patient states that she was carrying her dog on Wednesday.  She states that the dog attempted to jump out of her arms and she tried to catch him.  In doing so she fell forward.  She states that when she fell she injured her left shoulder.  She also reports injury to the right arm particular around the elbow.  Continues to have pain.  Pain 8/10 in severity.  She reports decreased range of motion of the left shoulder.  She reports bruising of the right rib around the elbow and forearm.  No relieving factors.  No other associated symptoms.  No other complaints.  Past Medical History:  Diagnosis Date  . Anxiety   . Arthritis   . Bladder neoplasm   . Borderline diabetes   . Chronic back pain   . Coronary artery disease    S/P  DES X2 TO MLAD  . Diabetes mellitus without complication ([place])   . Difficult intubation    pt states was told she has a crooked airway-- REFER TO ANESTHESIA  RECORD FROM 06-07-2011 SURG. AT [facility]-- GLIDESCOPE USED WITHOUT ISSUES  . Diverticulosis   . GERD (gastroesophageal reflux disease)   . H/O hiatal hernia   . 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 Hyperlipidemia   . Hypertension   . Multilevel spondylosis   . Nocturia   . Osteoporosis   . PONV (postoperative nausea and vomiting)   . Psoriasis   . PVC's (premature ventricular contractions)   . Retroperitoneal hematoma    PER CT  . Right shoulder pain    S/P   ROTATOR CUFF REPAIR 10/  2016  . S/P drug eluting coronary stent placement   . Urinary frequency    Past Surgical History:  Procedure Laterality Date  . BELPHAROPTOSIS REPAIR Bilateral 2014  . CARDIAC CATHETERIZATION  09-17-2008   [facility]   20% mRCA, 20% ostial RCA, 10% OM2,  patent  previous stents, ef 68%  . CARDIAC CATHETERIZATION  05-14-2010  [facility]   no change since previous cath  . CAROTID ENDARTERECTOMY Right 07-10-2009  . CATARACT EXTRACTION W/ INTRAOCULAR LENS  IMPLANT, BILATERAL  2015  . CHOLECYSTECTOMY  1990  . COLONOSCOPY    . COLONOSCOPY WITH PROPOFOL N/A 03/09/2017   Procedure: COLONOSCOPY WITH PROPOFOL;  Surgeon: Manya Silvas, MD;  Location: [facility] ENDOSCOPY;  Service: Endoscopy;  Laterality: N/A;  . CORONARY ANGIOPLASTY WITH STENT PLACEMENT  10/ 2008  [facility]   DES X1 to mLAD  . CORONARY ANGIOPLASTY WITH STENT PLACEMENT  09/ 2009  [facility]   DES X1  tp mLAD  . CYSTOSCOPY WITH BIOPSY N/A 05/16/2015   Procedure: CYSTOSCOPY WITH BIOPSY AND FULGERATION ;  Surgeon: Festus Aloe, MD;  Location: [facility];  Service: Urology;  Laterality: N/A;  . HAND SURGERY Right 2005  . LUMBAR LAMINECTOMY/DECOMPRESSION MICRODISCECTOMY  06/07/2011   Procedure: LUMBAR LAMINECTOMY/DECOMPRESSION MICRODISCECTOMY 2 LEVELS;  Surgeon: Elaina Hoops, MD;  Location: [facility] NEURO ORS;  Service: Neurosurgery;  Laterality: Left;   Lumbar Two-Three, Lumbar Three-Four Decompressive Lumbar Laminectomy  . SHOULDER ARTHROSCOPY WITH OPEN ROTATOR CUFF REPAIR Right 02/20/2015   Procedure: SHOULDER ARTHROSCOPY WITH OPEN ROTATOR CUFF  REPAIR and decompression;  Surgeon: Corky Mull, MD;  Location: [facility] ORS;  Service: Orthopedics;  Laterality: Right;  . TONSILLECTOMY AND ADENOIDECTOMY  1956  . TRANSURETHRAL RESECTION OF BLADDER TUMOR WITH MITOMYCIN-C N/A 02/05/2014   Procedure: TRANSURETHRAL RESECTION OF BLADDER TUMOR ;  Surgeon: Festus Aloe, MD;  Location: [facility];  Service: Urology;  Laterality: N/A;  . VAGINAL HYSTERECTOMY  1976    OB History   No obstetric history on file.      Home Medications    Prior to Admission medications   Medication Sig Start Date End Date Taking? Authorizing Provider  acetaminophen (TYLENOL) 500 MG tablet Take 1,000 mg by mouth  every 6 (six) hours as needed.   Yes [provider]  aspirin EC 81 MG tablet Take 81 mg by mouth daily.   Yes [provider]  atorvastatin (LIPITOR) 40 MG tablet Take 40 mg by mouth every evening.    Yes [provider]  benazepril (LOTENSIN) 40 MG tablet Take 40 mg by mouth every morning.    Yes [provider]  carvedilol (COREG) 25 MG tablet Take 25 mg by mouth 2 (two) times daily with a meal.   Yes [provider]  citalopram (CELEXA) 10 MG tablet Take 10 mg by mouth every evening.   Yes [provider]  esomeprazole (NEXIUM) 40 MG capsule Take 40 mg by mouth at bedtime.    Yes [provider]  fluticasone (FLONASE) 50 MCG/ACT nasal spray Place 2 sprays into both nostrils daily.   Yes [provider]  Multiple Vitamins-Minerals (MULTIVITAMINS THER. W/MINERALS) TABS Take 1 tablet by mouth daily.   Yes [provider]  nitroGLYCERIN (NITROSTAT) 0.4 MG SL tablet Place 0.4 mg under the tongue every 5 (five) minutes as needed. For chest pain    [provider]  traMADol (ULTRAM) 50 MG tablet Take 1 tablet (50 mg total) by mouth every 8 (eight) hours as needed. 10/07/19   Coral Spikes, DO    Family History Family History  Problem Relation Age of Onset  . Anesthesia problems Neg Hx   . Hypotension Neg Hx   . Malignant hyperthermia Neg Hx   . Pseudochol deficiency Neg Hx     Social History Social History   Tobacco Use  . Smoking status: Former Smoker    Packs/day: 0.25    Years: 5.00    Pack years: 1.25    Types: Cigarettes    Quit date: 01/31/1999    Years since quitting: 20.6  . Smokeless tobacco: Never Used  Substance Use Topics  . Alcohol use: No  . Drug use: No     Allergies   Shellfish allergy, Aciphex [rabeprazole sodium], Biaxin [clarithromycin], Codeine, Evista [raloxifene], Levaquin [levofloxacin in d5w], Losartan, Myrbetriq [mirabegron], Norvasc [amlodipine besylate], Prevacid  [lansoprazole], Roxicodone [oxycodone], Sulfa antibiotics, Tetanus toxoids, Zoloft [sertraline hcl], and Amoxicillin   Review of Systems Review of Systems  Constitutional: Negative.   Musculoskeletal:       Left shoulder pain, right arm pain.   Physical Exam Triage Vital Signs ED Triage Vitals  Enc Vitals Group     BP 10/07/19 1440 (!) 158/68     Pulse Rate 10/07/19 1440 63     Resp 10/07/19 1440 14     Temp 10/07/19 1440 98.5 F (36.9 C)     Temp Source 10/07/19 1440 Oral     SpO2 10/07/19 1440 100 %     Weight 10/07/19 1437 150  lb (68 kg)     Height 10/07/19 1437 5' (1.524 m)     Head Circumference --      Peak Flow --      Pain Score 10/07/19 1437 8     Pain Loc --      Pain Edu? --      Excl. in [place]? --    Updated Vital Signs BP (!) 158/68 (BP Location: Left Arm)   Pulse 63   Temp 98.5 F (36.9 C) (Oral)   Resp 14   Ht 5' (1.524 m)   Wt 68 kg   SpO2 100%   BMI 29.29 kg/m   Visual Acuity Right Eye Distance:   Left Eye Distance:   Bilateral Distance:    Right Eye Near:   Left Eye Near:    Bilateral Near:     Physical Exam Vitals and nursing note reviewed.  Constitutional:      General: She is not in acute distress.    Appearance: Normal appearance. She is not ill-appearing.  HENT:     Head: Normocephalic and atraumatic.  Eyes:     General:        Right eye: No discharge.        Left eye: No discharge.     Conjunctiva/sclera: Conjunctivae normal.  Cardiovascular:     Rate and Rhythm: Normal rate and regular rhythm.  Pulmonary:     Effort: Pulmonary effort is normal. No respiratory distress.  Musculoskeletal:     Comments: Right elbow -slightly decreased range of motion secondary to pain.  Left shoulder -exam limited due to patient's pain.  Decreased range of motion.  Tenderness particular around the posterior aspect of the shoulder.  Neurological:     Mental Status: She is alert.  Psychiatric:        Mood and Affect: Mood normal.         Behavior: Behavior normal.    UC Treatments / Results  Labs (all labs ordered are listed, but only abnormal results are displayed) Labs Reviewed - No data to display  EKG   Radiology DG Elbow Complete Right  Result Date: 10/07/2019 CLINICAL DATA:  Recent fall. EXAM: RIGHT ELBOW - COMPLETE 3+ VIEW COMPARISON:  None. FINDINGS: Mildly depressed radial head fracture best seen on the AP view. IMPRESSION: Mildly depressed radial head fracture best seen on the AP view. No other abnormalities. Electronically Signed   By: Dorise Bullion III M.D   On: 10/07/2019 15:15   DG Shoulder Left  Result Date: 10/07/2019 CLINICAL DATA:  Pain after fall. EXAM: LEFT SHOULDER - 2+ VIEW COMPARISON:  None. FINDINGS: A sclerotic lesion in the proximal left humerus has been present since at least 2011. No fractures or dislocations. IMPRESSION: Stable sclerotic lesion in the proximal left humerus, unchanged since 2011 and of no significance. No fracture dislocation. Electronically Signed   By: Dorise Bullion III M.D   On: 10/07/2019 15:16    Procedures Procedures (including critical care time)  Medications Ordered in UC Medications - No data to display  Initial Impression / Assessment and Plan / UC Course  I have reviewed the triage vital signs and the nursing notes.  Pertinent labs & imaging results that were available during my care of the patient were reviewed by me and considered in my medical decision making (see chart for details).    74 year old female presents with shoulder pain and right elbow pain after suffering a fall.  X-rays obtained and independently reviewed by  me.  X-ray of the elbow revealed a radial head fracture.  X-ray of the shoulder revealed no evident fracture or dislocation.  There is a sclerotic lesion.  Patient placed in sling for comfort regarding radial head fracture.  Advised to see orthopedics.  Tramadol as needed for pain.  Final Clinical Impressions(s) / UC Diagnoses    Final diagnoses:  Closed nondisplaced fracture of head of right radius, initial encounter  Acute pain of left shoulder     Discharge Instructions     Medication as directed.  Please call [facility] [phone]) OR [facility] [phone]) for an appt.  Take care  Dr. Lacinda Axon     ED Prescriptions    Medication Sig Dispense Auth. Provider   traMADol (ULTRAM) 50 MG tablet Take 1 tablet (50 mg total) by mouth every 8 (eight) hours as needed. 15 tablet Thersa Salt G, DO     I have reviewed the PDMP during this encounter.   Coral Spikes, DO 10/07/19 [place], [place] 10/07/19 1557

## 2019-10-07 NOTE — ED Triage Notes (Signed)
Patient states that she was carrying her dog and tripped on a stomp and fell forward on Wed Patient c/o left shoulder and right lower arm pain.

## 2019-10-07 NOTE — Discharge Instructions (Signed)
Medication as directed.  Please call [facility] [phone]) OR [facility] [phone]) for an appt.  Take care  Dr. Lacinda Axon

## 2020-04-01 ENCOUNTER — Encounter: Payer: Self-pay | Admitting: Internal Medicine

## 2020-04-04 ENCOUNTER — Other Ambulatory Visit
Admission: RE | Admit: 2020-04-04 | Discharge: 2020-04-04 | Disposition: A | Discharge status: Home / Self Care | Payer: Medicare Other | Source: Ambulatory Visit | Attending: Internal Medicine | Admitting: Internal Medicine

## 2020-04-04 ENCOUNTER — Other Ambulatory Visit: Payer: Self-pay

## 2020-04-04 DIAGNOSIS — Z20822 Contact with and (suspected) exposure to covid-19: Secondary | ICD-10-CM | POA: Insufficient documentation

## 2020-04-04 DIAGNOSIS — Z01818 Encounter for other preprocedural examination: Secondary | ICD-10-CM | POA: Diagnosis present

## 2020-04-05 LAB — SARS CORONAVIRUS 2 (TAT 6-24 HRS): SARS Coronavirus 2: NEGATIVE

## 2020-04-07 ENCOUNTER — Ambulatory Visit: Payer: Medicare Other | Admitting: Certified Registered Nurse Anesthetist

## 2020-04-07 ENCOUNTER — Encounter: Payer: Self-pay | Admitting: Internal Medicine

## 2020-04-07 ENCOUNTER — Ambulatory Visit
Admission: RE | Admit: 2020-04-07 | Discharge: 2020-04-07 | Disposition: A | Discharge status: Home / Self Care | Payer: Medicare Other | Attending: Internal Medicine | Admitting: Internal Medicine

## 2020-04-07 ENCOUNTER — Encounter
Admission: RE | Disposition: A | Discharge status: Home / Self Care | Payer: Self-pay | Source: Home / Self Care | Attending: Internal Medicine

## 2020-04-07 ENCOUNTER — Other Ambulatory Visit: Payer: Self-pay

## 2020-04-07 DIAGNOSIS — K449 Diaphragmatic hernia without obstruction or gangrene: Secondary | ICD-10-CM | POA: Insufficient documentation

## 2020-04-07 DIAGNOSIS — Z79899 Other long term (current) drug therapy: Secondary | ICD-10-CM | POA: Insufficient documentation

## 2020-04-07 DIAGNOSIS — R1011 Right upper quadrant pain: Secondary | ICD-10-CM | POA: Diagnosis present

## 2020-04-07 DIAGNOSIS — Z885 Allergy status to narcotic agent status: Secondary | ICD-10-CM | POA: Insufficient documentation

## 2020-04-07 DIAGNOSIS — Z7982 Long term (current) use of aspirin: Secondary | ICD-10-CM | POA: Insufficient documentation

## 2020-04-07 DIAGNOSIS — Z882 Allergy status to sulfonamides status: Secondary | ICD-10-CM | POA: Insufficient documentation

## 2020-04-07 DIAGNOSIS — Z8551 Personal history of malignant neoplasm of bladder: Secondary | ICD-10-CM | POA: Diagnosis not present

## 2020-04-07 DIAGNOSIS — Z881 Allergy status to other antibiotic agents status: Secondary | ICD-10-CM | POA: Diagnosis not present

## 2020-04-07 DIAGNOSIS — K2970 Gastritis, unspecified, without bleeding: Secondary | ICD-10-CM | POA: Insufficient documentation

## 2020-04-07 DIAGNOSIS — Z955 Presence of coronary angioplasty implant and graft: Secondary | ICD-10-CM | POA: Insufficient documentation

## 2020-04-07 DIAGNOSIS — K2270 Barrett's esophagus without dysplasia: Secondary | ICD-10-CM | POA: Diagnosis not present

## 2020-04-07 DIAGNOSIS — Z888 Allergy status to other drugs, medicaments and biological substances status: Secondary | ICD-10-CM | POA: Insufficient documentation

## 2020-04-07 DIAGNOSIS — Z880 Allergy status to penicillin: Secondary | ICD-10-CM | POA: Insufficient documentation

## 2020-04-07 HISTORY — DX: Diffuse cystic mastopathy of unspecified breast: N60.19

## 2020-04-07 HISTORY — PX: ESOPHAGOGASTRODUODENOSCOPY (EGD) WITH PROPOFOL: SHX5813

## 2020-04-07 HISTORY — DX: Carpal tunnel syndrome, unspecified upper limb: G56.00

## 2020-04-07 HISTORY — DX: Transient visual loss, bilateral: H53.123

## 2020-04-07 HISTORY — DX: Unspecified cataract: H26.9

## 2020-04-07 HISTORY — DX: Cerebrovascular disease, unspecified: I67.9

## 2020-04-07 HISTORY — DX: Deficiency of other specified B group vitamins: E53.8

## 2020-04-07 HISTORY — DX: Allergy status to unspecified drugs, medicaments and biological substances: Z88.9

## 2020-04-07 SURGERY — ESOPHAGOGASTRODUODENOSCOPY (EGD) WITH PROPOFOL
Anesthesia: General

## 2020-04-07 MED ORDER — LIDOCAINE HCL (CARDIAC) PF 100 MG/5ML IV SOSY
PREFILLED_SYRINGE | INTRAVENOUS | Status: DC | PRN
  Administered 2020-04-07: 80 mg via INTRAVENOUS

## 2020-04-07 MED ORDER — ONDANSETRON HCL 4 MG/2ML IJ SOLN
INTRAMUSCULAR | Status: DC | PRN
  Administered 2020-04-07: 4 mg via INTRAVENOUS

## 2020-04-07 MED ORDER — PHENYLEPHRINE HCL (PRESSORS) 10 MG/ML IV SOLN
INTRAVENOUS | Status: DC | PRN
  Administered 2020-04-07: 100 ug via INTRAVENOUS

## 2020-04-07 MED ORDER — PROPOFOL 10 MG/ML IV BOLUS
INTRAVENOUS | Status: DC | PRN
  Administered 2020-04-07 (×2): 30 mg via INTRAVENOUS

## 2020-04-07 MED ORDER — PROPOFOL 500 MG/50ML IV EMUL
INTRAVENOUS | Status: DC | PRN
  Administered 2020-04-07: 140 ug/kg/min via INTRAVENOUS

## 2020-04-07 MED ORDER — SODIUM CHLORIDE 0.9 % IV SOLN: INTRAVENOUS | Status: DC

## 2020-04-07 MED ORDER — GLYCOPYRROLATE 0.2 MG/ML IJ SOLN
INTRAMUSCULAR | Status: DC | PRN
  Administered 2020-04-07: .2 mg via INTRAVENOUS

## 2020-04-07 NOTE — Transfer of Care (Signed)
Immediate Anesthesia Transfer of Care Note  Patient: Meagan Osborne  Procedure(s) Performed: ESOPHAGOGASTRODUODENOSCOPY (EGD) WITH PROPOFOL (N/A )  Patient Location: PACU and Endoscopy Unit  Anesthesia Type:General  Level of Consciousness: drowsy  Airway & Oxygen Therapy: Patient Spontanous Breathing and Patient connected to nasal cannula oxygen  Post-op Assessment: Report given to RN and Post -op Vital signs reviewed and stable  Post vital signs: Reviewed and stable  Last Vitals:  Vitals Value Taken Time  BP 107/40   Temp    Pulse 63 04/07/20 0918  Resp 20 04/07/20 0918  SpO2 97 % 04/07/20 0918  Vitals shown include unvalidated device data.  Last Pain:  Vitals:   04/07/20 0917  TempSrc: Temporal  PainSc: 0-No pain         Complications: No complications documented.

## 2020-04-07 NOTE — Interval H&P Note (Signed)
History and Physical Interval Note:  04/07/2020 9:01 AM  Meagan Osborne  has presented today for surgery, with the diagnosis of Reflux RUQ pain.  The various methods of treatment have been discussed with the patient and family. After consideration of risks, benefits and other options for treatment, the patient has consented to  Procedure(s): ESOPHAGOGASTRODUODENOSCOPY (EGD) WITH PROPOFOL (N/A) as a surgical intervention.  The patient's history has been reviewed, patient examined, no change in status, stable for surgery.  I have reviewed the patient's chart and labs.  Questions were answered to the patient's satisfaction.     [place], [place]

## 2020-04-07 NOTE — Anesthesia Preprocedure Evaluation (Signed)
Anesthesia Evaluation  Patient identified by MRN, date of birth, ID band Patient awake    Reviewed: Allergy & Precautions, NPO status , Patient's Chart, lab work & pertinent test results  History of Anesthesia Complications (+) PONV, DIFFICULT AIRWAY and history of anesthetic complications  Airway Mallampati: III       Dental   Pulmonary neg sleep apnea, neg COPD, Not current smoker, former smoker,           Cardiovascular hypertension, Pt. on medications + Cardiac Stents  (-) Past MI and (-) CHF + dysrhythmias (-) Valvular Problems/Murmurs     Neuro/Psych neg Seizures Anxiety    GI/Hepatic Neg liver ROS, hiatal hernia, GERD  Medicated and Poorly Controlled,  Endo/Other  diabetes (borderline), Type 2  Renal/GU      Musculoskeletal   Abdominal   Peds  Hematology   Anesthesia Other Findings   Reproductive/Obstetrics                             Anesthesia Physical Anesthesia Plan  ASA: III  Anesthesia Plan: General   Post-op Pain Management:    Induction: Intravenous  PONV Risk Score and Plan: 4 or greater and Propofol infusion, TIVA and Treatment may vary due to age or medical condition  Airway Management Planned: Nasal Cannula  Additional Equipment:   Intra-op Plan:   Post-operative Plan:   Informed Consent: I have reviewed the patients History and Physical, chart, labs and discussed the procedure including the risks, benefits and alternatives for the proposed anesthesia with the patient or authorized representative who has indicated his/her understanding and acceptance.       Plan Discussed with:   Anesthesia Plan Comments:         Anesthesia Quick Evaluation

## 2020-04-07 NOTE — Op Note (Signed)
[facility] Patient Name: Meagan Osborne Procedure Date: 04/07/2020 9:02 AM MRN: [id number] Account #: [id number] Date of Birth: June 03, 1945 Admit Type: Outpatient Age: 74 Room: [facility] ENDO ROOM 2 Gender: Female Note Status: Finalized Procedure:             Upper GI endoscopy Indications:           Abdominal pain in the right upper quadrant, Follow-up                         of esophageal reflux Providers:             Benay Pike. [name] MD, MD Referring MD:          Dion Body (Referring MD) Medicines:             Propofol per Anesthesia Complications:         No immediate complications. Estimated blood loss: None. Procedure:             Pre-Anesthesia Assessment:                        - The risks and benefits of the procedure and the                         sedation options and risks were discussed with the                         patient. All questions were answered and informed                         consent was obtained.                        - Patient identification and proposed procedure were                         verified prior to the procedure by the nurse. The                         procedure was verified in the procedure room.                        After obtaining informed consent, the endoscope was                         passed under direct vision. Throughout the procedure,                         the patient's blood pressure, pulse, and oxygen                         saturations were monitored continuously. The Endoscope                         was introduced through the mouth, and advanced to the                         third part of duodenum. The upper GI endoscopy was  accomplished without difficulty. The patient tolerated                         the procedure well. Findings:      The Z-line was irregular and was found 34 cm from the incisors. Mucosa       was biopsied with a cold forceps for  histology. One specimen bottle was       sent to pathology.      Patchy moderate inflammation characterized by congestion (edema) and       erythema was found in the gastric antrum. Biopsies were taken with a       cold forceps for Helicobacter pylori testing.      A 2 cm hiatal hernia was present.      The examined duodenum was normal.      The exam was otherwise without abnormality. Impression:            - Z-line irregular, 34 cm from the incisors. Biopsied.                        - Gastritis. Biopsied.                        - 2 cm hiatal hernia.                        - Normal examined duodenum.                        - The examination was otherwise normal. Recommendation:        - Patient has a contact number available for                         emergencies. The signs and symptoms of potential                         delayed complications were discussed with the patient.                         Return to normal activities tomorrow. Written                         discharge instructions were provided to the patient.                        - Resume previous diet.                        - Continue present medications.                        - Await pathology results.                        - Return to my office in 3 months.                        - The findings and recommendations were discussed with                         the patient. Procedure Code(s):     ---  Professional ---                        [phone], Esophagogastroduodenoscopy, flexible,                         transoral; with biopsy, single or multiple Diagnosis Code(s):     --- Professional ---                        K21.9, Gastro-esophageal reflux disease without                         esophagitis                        R10.11, Right upper quadrant pain                        K44.9, Diaphragmatic hernia without obstruction or                         gangrene                        K29.70, Gastritis, unspecified,  without bleeding                        K22.8, Other specified diseases of esophagus CPT copyright 2019 American Medical Association. All rights reserved. The codes documented in this report are preliminary and upon coder review may  be revised to meet current compliance requirements. Efrain Sella MD, MD 04/07/2020 9:16:48 AM This report has been signed electronically. Number of Addenda: 0 Note Initiated On: 04/07/2020 9:02 AM Estimated Blood Loss:  Estimated blood loss: none.      [facility]

## 2020-04-07 NOTE — H&P (Signed)
Outpatient short stay form Pre-procedure 04/07/2020 8:59 AM Meagan Osborne, M.D.  Primary Physician: Dion Body, M.D.  Reason for visit:  RUQ pain, GERD.  History of present illness: Patient with GERD, moderately well controlled on Nexium, has persistent, recurrent RUQ pain   Current Facility-Administered Medications:  .  0.9 %  sodium chloride infusion, , Intravenous, Continuous, [place], Benay Pike, MD, Last Rate: 20 mL/hr at 04/07/20 0848, New Bag at 04/07/20 0848  Medications Prior to Admission  Medication Sig Dispense Refill Last Dose  . acetaminophen (TYLENOL) 500 MG tablet Take 1,000 mg by mouth every 6 (six) hours as needed.   04/06/2020 at 0800  . aspirin EC 81 MG tablet Take 81 mg by mouth daily.   04/06/2020 at 0800  . atorvastatin (LIPITOR) 40 MG tablet Take 40 mg by mouth every evening.    04/06/2020 at 2000  . benazepril (LOTENSIN) 40 MG tablet Take 40 mg by mouth every morning.    04/07/2020 at 0600  . carvedilol (COREG) 25 MG tablet Take 25 mg by mouth 2 (two) times daily with a meal.   04/07/2020 at 0600  . citalopram (CELEXA) 10 MG tablet Take 10 mg by mouth every evening.   04/06/2020 at 0800  . esomeprazole (NEXIUM) 40 MG capsule Take 40 mg by mouth at bedtime.    04/06/2020 at 2000  . fluticasone (FLONASE) 50 MCG/ACT nasal spray Place 2 sprays into both nostrils daily.   04/06/2020 at 0800  . Multiple Vitamins-Minerals (MULTIVITAMINS THER. W/MINERALS) TABS Take 1 tablet by mouth daily.   04/06/2020 at 0800  . nitroGLYCERIN (NITROSTAT) 0.4 MG SL tablet Place 0.4 mg under the tongue every 5 (five) minutes as needed. For chest pain   Past Month at Unknown time  . traMADol (ULTRAM) 50 MG tablet Take 1 tablet (50 mg total) by mouth every 8 (eight) hours as needed. 15 tablet 0 Past Month at Unknown time     Allergies  Allergen Reactions  . Shellfish Allergy Anaphylaxis and Hives    ALL SHELLFISH  . Aciphex [Rabeprazole Sodium] Other (See Comments)     migrianes  . Biaxin [Clarithromycin] Hives and Swelling  . Codeine Other (See Comments)    insomnia  . Colchicine Other (See Comments)    UNKNOWN  . Evista [Raloxifene]   . Levaquin [Levofloxacin In D5w] Other (See Comments)    insomnia  . Losartan Swelling  . Myrbetriq [Mirabegron] Other (See Comments)    DID NOT WORK  . Norvasc [Amlodipine Besylate] Swelling  . Prevacid [Lansoprazole] Other (See Comments)    "it didn't work"  . Roxicodone [Oxycodone] Nausea And Vomiting    SEVERE  . Sulfa Antibiotics Hives  . Tetanus Toxoids Other (See Comments)    Red, swelling  . Zoloft [Sertraline Hcl] Other (See Comments)    Deep depression  . Amoxicillin Rash     Past Medical History:  Diagnosis Date  . Allergic genetic state   . Anxiety   . Arthritis   . B12 deficiency   . Bladder neoplasm   . Borderline diabetes   . Cancer (HCC)    BLADDER CANCER  . Carpal tunnel syndrome   . Cataracts, bilateral   . Cerebrovascular disease   . Chronic back pain   . Coronary artery disease    S/P  DES X2 TO MLAD  . Diabetes mellitus without complication (HCC)    borderline  . Difficult intubation    pt states was told she has a crooked  airway-- REFER TO ANESTHESIA  RECORD FROM 06-07-2011 SURG. AT [facility]-- GLIDESCOPE USED WITHOUT ISSUES  . Diverticulosis   . Fibrocystic breast disease   . GERD (gastroesophageal reflux disease)   . H/O hiatal hernia   . 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 Hyperlipidemia   . Hypertension   . Multilevel spondylosis   . Nocturia   . Osteoporosis   . PONV (postoperative nausea and vomiting)   . Psoriasis   . PVC's (premature ventricular contractions)   . Retroperitoneal hematoma    PER CT  . Right shoulder pain    S/P   ROTATOR CUFF REPAIR 10/  2016  . S/P drug eluting coronary stent placement   . Transient visual loss of both eyes   . Urinary frequency     Review of systems:  Otherwise negative.    Physical Exam  Gen:  Alert, oriented. Appears stated age.  HEENT: [place]/AT. PERRLA. Lungs: CTA, no wheezes. CV: RR nl S1, S2. Abd: soft, benign, no masses. BS+ Ext: No edema. Pulses 2+    Planned procedures: Proceed with EGD. The patient understands the nature of the planned procedure, indications, risks, alternatives and potential complications including but not limited to bleeding, infection, perforation, damage to internal organs and possible oversedation/side effects from anesthesia. The patient agrees and gives consent to proceed.  Please refer to procedure notes for findings, recommendations and patient disposition/instructions.     Meagan Osborne, M.D. Gastroenterology 04/07/2020  8:59 AM

## 2020-04-07 NOTE — Anesthesia Postprocedure Evaluation (Signed)
Anesthesia Post Note  Patient: Meagan Osborne  Procedure(s) Performed: ESOPHAGOGASTRODUODENOSCOPY (EGD) WITH PROPOFOL (N/A )  Patient location during evaluation: Endoscopy Anesthesia Type: General Level of consciousness: awake and alert Pain management: pain level controlled Vital Signs Assessment: post-procedure vital signs reviewed and stable Respiratory status: spontaneous breathing and respiratory function stable Cardiovascular status: stable Anesthetic complications: no   No complications documented.   Last Vitals:  Vitals:   04/07/20 0927 04/07/20 0937  BP: 101/74 116/86  Pulse: 76 65  Resp: (!) 23 15  Temp:    SpO2: 94% 98%    Last Pain:  Vitals:   04/07/20 0937  TempSrc:   PainSc: 0-No pain                 Azlan Hanway K

## 2020-04-07 NOTE — Anesthesia Procedure Notes (Signed)
Procedure Name: MAC Date/Time: 04/07/2020 9:10 AM Performed by: Lily Peer, Venetta Knee, CRNA Pre-anesthesia Checklist: Patient identified, Emergency Drugs available, Suction available and Patient being monitored Patient Re-evaluated:Patient Re-evaluated prior to induction Oxygen Delivery Method: Nasal cannula

## 2020-04-08 ENCOUNTER — Encounter: Payer: Self-pay | Admitting: Internal Medicine

## 2020-04-08 LAB — SURGICAL PATHOLOGY

## 2020-05-29 ENCOUNTER — Other Ambulatory Visit: Payer: Self-pay | Admitting: Dermatology

## 2020-07-21 ENCOUNTER — Other Ambulatory Visit: Payer: Self-pay

## 2020-07-21 ENCOUNTER — Ambulatory Visit: Payer: Medicare Other | Admitting: Dermatology

## 2020-07-21 DIAGNOSIS — L409 Psoriasis, unspecified: Secondary | ICD-10-CM

## 2020-07-21 MED ORDER — FLUOCINONIDE 0.05 % EX SOLN: 1.0000 "application " | Freq: Every day | CUTANEOUS | 1 refills | Status: DC | PRN

## 2020-08-02 ENCOUNTER — Emergency Department: Payer: Medicare Other

## 2020-08-02 ENCOUNTER — Inpatient Hospital Stay
Admission: EM | Admit: 2020-08-02 | Discharge: 2020-08-05 | DRG: 392 | Disposition: A | Discharge status: Home / Self Care | Payer: Medicare Other | Attending: Internal Medicine | Admitting: Internal Medicine

## 2020-08-02 ENCOUNTER — Other Ambulatory Visit: Payer: Self-pay

## 2020-08-02 DIAGNOSIS — K529 Noninfective gastroenteritis and colitis, unspecified: Secondary | ICD-10-CM

## 2020-08-02 DIAGNOSIS — Z955 Presence of coronary angioplasty implant and graft: Secondary | ICD-10-CM

## 2020-08-02 DIAGNOSIS — Z79899 Other long term (current) drug therapy: Secondary | ICD-10-CM

## 2020-08-02 DIAGNOSIS — N179 Acute kidney failure, unspecified: Principal | ICD-10-CM

## 2020-08-02 DIAGNOSIS — I2510 Atherosclerotic heart disease of native coronary artery without angina pectoris: Secondary | ICD-10-CM | POA: Diagnosis present

## 2020-08-02 DIAGNOSIS — Z961 Presence of intraocular lens: Secondary | ICD-10-CM | POA: Diagnosis present

## 2020-08-02 DIAGNOSIS — Z87891 Personal history of nicotine dependence: Secondary | ICD-10-CM

## 2020-08-02 DIAGNOSIS — Z9049 Acquired absence of other specified parts of digestive tract: Secondary | ICD-10-CM

## 2020-08-02 DIAGNOSIS — K760 Fatty (change of) liver, not elsewhere classified: Secondary | ICD-10-CM | POA: Diagnosis present

## 2020-08-02 DIAGNOSIS — Z9842 Cataract extraction status, left eye: Secondary | ICD-10-CM

## 2020-08-02 DIAGNOSIS — F32A Depression, unspecified: Secondary | ICD-10-CM | POA: Diagnosis present

## 2020-08-02 DIAGNOSIS — Z882 Allergy status to sulfonamides status: Secondary | ICD-10-CM

## 2020-08-02 DIAGNOSIS — Z9841 Cataract extraction status, right eye: Secondary | ICD-10-CM

## 2020-08-02 DIAGNOSIS — E861 Hypovolemia: Secondary | ICD-10-CM | POA: Diagnosis present

## 2020-08-02 DIAGNOSIS — I10 Essential (primary) hypertension: Secondary | ICD-10-CM | POA: Diagnosis present

## 2020-08-02 DIAGNOSIS — Z885 Allergy status to narcotic agent status: Secondary | ICD-10-CM

## 2020-08-02 DIAGNOSIS — A080 Rotaviral enteritis: Principal | ICD-10-CM | POA: Diagnosis present

## 2020-08-02 DIAGNOSIS — K2270 Barrett's esophagus without dysplasia: Secondary | ICD-10-CM | POA: Diagnosis present

## 2020-08-02 DIAGNOSIS — Z8551 Personal history of malignant neoplasm of bladder: Secondary | ICD-10-CM

## 2020-08-02 DIAGNOSIS — A09 Infectious gastroenteritis and colitis, unspecified: Secondary | ICD-10-CM | POA: Diagnosis present

## 2020-08-02 DIAGNOSIS — Z888 Allergy status to other drugs, medicaments and biological substances status: Secondary | ICD-10-CM

## 2020-08-02 DIAGNOSIS — H53123 Transient visual loss, bilateral: Secondary | ICD-10-CM | POA: Diagnosis present

## 2020-08-02 DIAGNOSIS — E860 Dehydration: Secondary | ICD-10-CM | POA: Diagnosis present

## 2020-08-02 DIAGNOSIS — E119 Type 2 diabetes mellitus without complications: Secondary | ICD-10-CM | POA: Diagnosis present

## 2020-08-02 DIAGNOSIS — K219 Gastro-esophageal reflux disease without esophagitis: Secondary | ICD-10-CM | POA: Diagnosis present

## 2020-08-02 DIAGNOSIS — E785 Hyperlipidemia, unspecified: Secondary | ICD-10-CM | POA: Diagnosis present

## 2020-08-02 DIAGNOSIS — E871 Hypo-osmolality and hyponatremia: Secondary | ICD-10-CM | POA: Diagnosis present

## 2020-08-02 DIAGNOSIS — Z91013 Allergy to seafood: Secondary | ICD-10-CM

## 2020-08-02 DIAGNOSIS — Z20822 Contact with and (suspected) exposure to covid-19: Secondary | ICD-10-CM | POA: Diagnosis present

## 2020-08-02 DIAGNOSIS — Z90710 Acquired absence of both cervix and uterus: Secondary | ICD-10-CM

## 2020-08-02 DIAGNOSIS — I959 Hypotension, unspecified: Secondary | ICD-10-CM | POA: Diagnosis present

## 2020-08-02 DIAGNOSIS — Z8673 Personal history of transient ischemic attack (TIA), and cerebral infarction without residual deficits: Secondary | ICD-10-CM

## 2020-08-02 LAB — CBC WITH DIFFERENTIAL/PLATELET
Abs Immature Granulocytes: 0.02 10*3/uL (ref 0.00–0.07)
Basophils Absolute: 0 10*3/uL (ref 0.0–0.1)
Basophils Relative: 0 %
Eosinophils Absolute: 0 10*3/uL (ref 0.0–0.5)
Eosinophils Relative: 0 %
HCT: 43.7 % (ref 36.0–46.0)
Hemoglobin: 14.3 g/dL (ref 12.0–15.0)
Immature Granulocytes: 0 %
Lymphocytes Relative: 27 %
Lymphs Abs: 1.8 10*3/uL (ref 0.7–4.0)
MCH: 31.5 pg (ref 26.0–34.0)
MCHC: 32.7 g/dL (ref 30.0–36.0)
MCV: 96.3 fL (ref 80.0–100.0)
Monocytes Absolute: 1.2 10*3/uL — ABNORMAL HIGH (ref 0.1–1.0)
Monocytes Relative: 18 %
Neutro Abs: 3.6 10*3/uL (ref 1.7–7.7)
Neutrophils Relative %: 55 %
Platelets: 236 10*3/uL (ref 150–400)
RBC: 4.54 MIL/uL (ref 3.87–5.11)
RDW: 12.5 % (ref 11.5–15.5)
WBC: 6.7 10*3/uL (ref 4.0–10.5)
nRBC: 0 % (ref 0.0–0.2)

## 2020-08-02 LAB — COMPREHENSIVE METABOLIC PANEL
ALT: 92 U/L — ABNORMAL HIGH (ref 0–44)
AST: 85 U/L — ABNORMAL HIGH (ref 15–41)
Albumin: 4.3 g/dL (ref 3.5–5.0)
Alkaline Phosphatase: 87 U/L (ref 38–126)
Anion gap: 12 (ref 5–15)
BUN: 28 mg/dL — ABNORMAL HIGH (ref 8–23)
CO2: 18 mmol/L — ABNORMAL LOW (ref 22–32)
Calcium: 9 mg/dL (ref 8.9–10.3)
Chloride: 104 mmol/L (ref 98–111)
Creatinine, Ser: 1.58 mg/dL — ABNORMAL HIGH (ref 0.44–1.00)
GFR, Estimated: 34 mL/min — ABNORMAL LOW (ref >60)
Glucose, Bld: 116 mg/dL — ABNORMAL HIGH (ref 70–99)
Potassium: 3.6 mmol/L (ref 3.5–5.1)
Sodium: 134 mmol/L — ABNORMAL LOW (ref 135–145)
Total Bilirubin: 0.9 mg/dL (ref 0.3–1.2)
Total Protein: 6.9 g/dL (ref 6.5–8.1)

## 2020-08-02 LAB — TROPONIN I (HIGH SENSITIVITY): Troponin I (High Sensitivity): 5 ng/L (ref <18)

## 2020-08-02 LAB — LIPASE, BLOOD: Lipase: 36 U/L (ref 11–51)

## 2020-08-02 MED ORDER — ONDANSETRON HCL 4 MG/2ML IJ SOLN
4.0000 mg | Freq: Once | INTRAMUSCULAR | Status: AC
  Administered 2020-08-02: 4 mg via INTRAVENOUS
  Filled 2020-08-02: qty 2

## 2020-08-02 MED ORDER — SODIUM CHLORIDE 0.9 % IV BOLUS
1000.0000 mL | Freq: Once | INTRAVENOUS | Status: AC
  Administered 2020-08-02: 1000 mL via INTRAVENOUS

## 2020-08-02 MED ORDER — IOHEXOL 300 MG/ML  SOLN
100.0000 mL | Freq: Once | INTRAMUSCULAR | Status: AC | PRN
  Administered 2020-08-02: 100 mL via INTRAVENOUS

## 2020-08-02 MED ORDER — LOPERAMIDE HCL 2 MG PO CAPS
4.0000 mg | ORAL_CAPSULE | Freq: Once | ORAL | Status: AC
  Administered 2020-08-03: 4 mg via ORAL
  Filled 2020-08-02: qty 2

## 2020-08-02 NOTE — ED Triage Notes (Signed)
Pt from home via ems for nausea and diarrhea since last Thursday. pmd [facility] clinic) called in odt zofran and lmotol w/o relief, no testing done. 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 Pt reports intermittent 'swelling/discomfort' to RUQ then episodes occur

## 2020-08-02 NOTE — ED Provider Notes (Signed)
[facility] Emergency Department Provider Note  ____________________________________________   Event Date/Time   First MD Initiated Contact with Patient 08/02/20 2114     (approximate)  I have reviewed the triage vital signs and the nursing notes.   HISTORY  Chief Complaint Weakness    HPI Meagan Osborne is a 75 y.o. female with coronary disease who comes in for nausea, vomiting, diarrhea.  Patient reports onset of the symptoms 3 days ago, constant, not better with Zofran and medication for diarrhea that she has been taking prescribed by her primary care doctor.  She is on unable to tolerate any p.o which is why she presented today.  Denies any chest pain or shortness of breath.  Does have pain in her upper abdomen that feels like a fullness sensation.  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  Patient states that she had multiple episodes of diarrhea that is nonbloody, nothing makes it better including the medication prescribed, nothing makes it worse.  She denies any other sick contacts.  She has not had any recent antibiotics.            Past Medical History:  Diagnosis Date  . Allergic genetic state   . Anxiety   . Arthritis   . B12 deficiency   . Bladder neoplasm   . Borderline diabetes   . Cancer (HCC)    BLADDER CANCER  . Carpal tunnel syndrome   . Cataracts, bilateral   . Cerebrovascular disease   . Chronic back pain   . Coronary artery disease    S/P  DES X2 TO MLAD  . Diabetes mellitus without complication (HCC)    borderline  . Difficult intubation    pt states was told she has a crooked airway-- REFER TO ANESTHESIA  RECORD FROM 06-07-2011 SURG. AT [facility]-- GLIDESCOPE USED WITHOUT ISSUES  . Diverticulosis   . Fibrocystic breast disease   . GERD (gastroesophageal reflux disease)   . H/O hiatal hernia   . 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 Hyperlipidemia   . Hypertension   . Multilevel  spondylosis   . Nocturia   . Osteoporosis   . PONV (postoperative nausea and vomiting)   . Psoriasis   . PVC's (premature ventricular contractions)   . Retroperitoneal hematoma    PER CT  . Right shoulder pain    S/P   ROTATOR CUFF REPAIR 10/  2016  . S/P drug eluting coronary stent placement   . Transient visual loss of both eyes   . Urinary frequency     There are no problems to display for this patient.   Past Surgical History:  Procedure Laterality Date  . APPENDECTOMY    . BACK SURGERY    . BELPHAROPTOSIS REPAIR Bilateral 2014  . CARDIAC CATHETERIZATION  09-17-2008   [facility]   20% mRCA, 20% ostial RCA, 10% OM2,  patent previous stents, ef 68%  . CARDIAC CATHETERIZATION  05-14-2010  [facility]   no change since previous cath  . CAROTID ENDARTERECTOMY Right 07-10-2009  . CATARACT EXTRACTION W/ INTRAOCULAR LENS  IMPLANT, BILATERAL  2015  . CHOLECYSTECTOMY  1990  . COLONOSCOPY    . COLONOSCOPY WITH PROPOFOL N/A 03/09/2017   Procedure: COLONOSCOPY WITH PROPOFOL;  Surgeon: Manya Silvas, MD;  Location: [facility] ENDOSCOPY;  Service: Endoscopy;  Laterality: N/A;  . CORONARY ANGIOPLASTY WITH STENT PLACEMENT  10/ 2008  [place]   DES X1 to mLAD  . CORONARY  ANGIOPLASTY WITH STENT PLACEMENT  09/ 2009  [facility]   DES X1  tp mLAD  . CYSTOSCOPY WITH BIOPSY N/A 05/16/2015   Procedure: CYSTOSCOPY WITH BIOPSY AND FULGERATION ;  Surgeon: Festus Aloe, MD;  Location: [facility];  Service: Urology;  Laterality: N/A;  . ESOPHAGOGASTRODUODENOSCOPY (EGD) WITH PROPOFOL N/A 04/07/2020   Procedure: ESOPHAGOGASTRODUODENOSCOPY (EGD) WITH PROPOFOL;  Surgeon: Toledo, Benay Pike, MD;  Location: [facility] ENDOSCOPY;  Service: Gastroenterology;  Laterality: N/A;  . EYE SURGERY    . HAND SURGERY Right 2005  . LUMBAR LAMINECTOMY/DECOMPRESSION MICRODISCECTOMY  06/07/2011   Procedure: LUMBAR LAMINECTOMY/DECOMPRESSION MICRODISCECTOMY 2 LEVELS;  Surgeon: Elaina Hoops, MD;  Location: [place] [facility];  Service:  Neurosurgery;  Laterality: Left;   Lumbar Two-Three, Lumbar Three-Four Decompressive Lumbar Laminectomy  . SHOULDER ARTHROSCOPY WITH OPEN ROTATOR CUFF REPAIR Right 02/20/2015   Procedure: SHOULDER ARTHROSCOPY WITH OPEN ROTATOR CUFF REPAIR and decompression;  Surgeon: Corky Mull, MD;  Location: [facility] ORS;  Service: Orthopedics;  Laterality: Right;  . TONSILLECTOMY AND ADENOIDECTOMY  1956  . TRANSURETHRAL RESECTION OF BLADDER TUMOR WITH MITOMYCIN-C N/A 02/05/2014   Procedure: TRANSURETHRAL RESECTION OF BLADDER TUMOR ;  Surgeon: Festus Aloe, MD;  Location: [facility];  Service: Urology;  Laterality: N/A;  . [place]    Prior to Admission medications   Medication Sig Start Date End Date Taking? Authorizing Provider  acetaminophen (TYLENOL) 500 MG tablet Take 1,000 mg by mouth every 6 (six) hours as needed.    [provider]  aspirin EC 81 MG tablet Take 81 mg by mouth daily.    [provider]  atorvastatin (LIPITOR) 40 MG tablet Take 40 mg by mouth every evening.     [provider]  benazepril (LOTENSIN) 40 MG tablet Take 40 mg by mouth every morning.     [provider]  carvedilol (COREG) 25 MG tablet Take 25 mg by mouth 2 (two) times daily with a meal.    [provider]  citalopram (CELEXA) 10 MG tablet Take 10 mg by mouth every evening.    [provider]  esomeprazole (NEXIUM) 40 MG capsule Take 40 mg by mouth at bedtime.     [provider]  fluocinonide (LIDEX) 0.05 % external solution Apply 1 application topically daily as needed. 07/21/20   Ralene Bathe, MD  fluticasone [facility]) 50 MCG/ACT nasal spray Place 2 sprays into both nostrils daily.    [provider]  Multiple Vitamins-Minerals (MULTIVITAMINS THER. W/MINERALS) TABS Take 1 tablet by mouth daily.    [provider]  nitroGLYCERIN (NITROSTAT) 0.4 MG SL tablet Place 0.4 mg under the tongue every 5 (five)  minutes as needed. For chest pain    [provider]  traMADol (ULTRAM) 50 MG tablet Take 1 tablet (50 mg total) by mouth every 8 (eight) hours as needed. 10/07/19   Coral Spikes, DO    Allergies Shellfish allergy, Aciphex [rabeprazole sodium], Biaxin [clarithromycin], Codeine, Colchicine, Evista [raloxifene], Levaquin [levofloxacin in d5w], Losartan, Myrbetriq [mirabegron], Norvasc [amlodipine besylate], Prevacid [lansoprazole], Roxicodone [oxycodone], Sulfa antibiotics, Tetanus toxoids, Zoloft [sertraline hcl], and Amoxicillin  Family History  Problem Relation Age of Onset  . Anesthesia problems Neg Hx   . Hypotension Neg Hx   . Malignant hyperthermia Neg Hx   . Pseudochol deficiency Neg Hx     Social History Social History   Tobacco Use  . Smoking status: Former Smoker    Packs/day: 0.25  Years: 5.00    Pack years: 1.25    Types: Cigarettes    Quit date: 01/31/1999    Years since quitting: 21.5  . Smokeless tobacco: Never Used  Vaping Use  . Vaping Use: Never used  Substance Use Topics  . Alcohol use: No  . Drug use: No      Review of Systems Constitutional: No fever/chills Eyes: No visual changes. ENT: No sore throat. Cardiovascular: Denies chest pain. Respiratory: Denies shortness of breath. Gastrointestinal: Positive abdominal pain, nausea, vomiting, diarrhea Genitourinary: Negative for dysuria. Musculoskeletal: Negative for back pain. Skin: Negative for rash. Neurological: Negative for headaches, focal weakness or numbness. All other ROS negative ____________________________________________   PHYSICAL EXAM:  VITAL SIGNS: Blood pressure (!) 104/50, pulse (!) 58, temperature 98.4 F (36.9 C), temperature source Oral, resp. rate 17, height 5' (1.524 m), weight 68.9 kg, SpO2 94 %.   Constitutional: Alert and oriented. Well appearing and in no acute distress. Eyes: Conjunctivae are normal. EOMI. Head: Atraumatic. Nose: No  congestion/rhinnorhea. Mouth/Throat: Mucous membranes are dry Neck: No stridor. Trachea Midline. FROM Cardiovascular: Normal rate, regular rhythm. Grossly normal heart sounds.  Good peripheral circulation. Respiratory: Normal respiratory effort.  No retractions. Lungs CTAB. Gastrointestinal: Slight tenderness in her abdomen no distention. No abdominal bruits.  Musculoskeletal: No lower extremity tenderness nor edema.  No joint effusions. Neurologic:  Normal speech and language. No gross focal neurologic deficits are appreciated.  Skin:  Skin is warm, dry and intact. No rash noted. Psychiatric: Mood and affect are normal. Speech and behavior are normal. GU: Deferred   ____________________________________________   LABS (all labs ordered are listed, but only abnormal results are displayed)  Labs Reviewed  CBC WITH DIFFERENTIAL/PLATELET - Abnormal; Notable for the following components:      Result Value   Monocytes Absolute 1.2 (*)    All other components within normal limits  COMPREHENSIVE METABOLIC PANEL - Abnormal; Notable for the following components:   Sodium 134 (*)    CO2 18 (*)    Glucose, Bld 116 (*)    BUN 28 (*)    Creatinine, Ser 1.58 (*)    AST 85 (*)    ALT 92 (*)    GFR, Estimated 34 (*)    All other components within normal limits  LIPASE, BLOOD  URINALYSIS, COMPLETE (UACMP) WITH MICROSCOPIC  TROPONIN I (HIGH SENSITIVITY)  TROPONIN I (HIGH SENSITIVITY)   ____________________________________________   ED ECG REPORT I, Vanessa Hayfield, the attending physician, personally viewed and interpreted this ECG.  Normal sinus rate of 66, no ST elevation, T wave inversion in V3, normal intervals ____________________________________________  RADIOLOGY   Official radiology report(s): CT ABDOMEN PELVIS W CONTRAST  Result Date: 08/02/2020 CLINICAL DATA:  Abdominal distension and pain EXAM: CT ABDOMEN AND PELVIS WITH CONTRAST TECHNIQUE: Multidetector CT imaging of the  abdomen and pelvis was performed using the standard protocol following bolus administration of intravenous contrast. CONTRAST:  139mL OMNIPAQUE IOHEXOL 300 MG/ML  SOLN COMPARISON:  04/16/2015 FINDINGS: Lower chest: No acute abnormality. Hepatobiliary: Fatty infiltration of the liver is noted. The gallbladder has been surgically removed. Pancreas: Unremarkable. No pancreatic ductal dilatation or surrounding inflammatory changes. Spleen: Normal in size without focal abnormality. Adrenals/Urinary Tract: Adrenal glands are within normal limits. Kidneys demonstrate no renal calculi or urinary tract obstructive changes. Normal excretion of contrast is noted on delayed images. Ureters are within normal limits. The bladder is decompressed. Stomach/Bowel: Scattered diverticular change of the colon is seen without evidence of diverticulitis. The  appendix has been surgically removed. Small bowel and stomach appear within normal limits. Vascular/Lymphatic: Aortic atherosclerosis. No enlarged abdominal or pelvic lymph nodes. Reproductive: Status post hysterectomy. No adnexal masses. Other: No abdominal wall hernia or abnormality. No abdominopelvic ascites. Musculoskeletal: T12 compression deformity is noted chronic in appearance stable from prior MRI from 2020. IMPRESSION: Fatty liver. No renal calculi or obstructive changes are noted. Diverticulosis without diverticulitis. Chronic T12 compression deformity. Electronically Signed   By: Inez Catalina M.D.   On: 08/02/2020 22:23    ____________________________________________   PROCEDURES  Procedure(s) performed (including Critical Care):  .1-3 Lead EKG Interpretation Performed by: Vanessa Minerva, MD Authorized by: Vanessa , MD     Interpretation: normal     ECG rate:  60s   ECG rate assessment: normal     Rhythm: sinus rhythm     Ectopy: none     Conduction: normal   Comments:     Occasionally sinus  bradycardia     ____________________________________________   INITIAL IMPRESSION / ASSESSMENT AND PLAN / ED COURSE  Meagan Osborne was evaluated in Emergency Department on 08/02/2020 for the symptoms described in the history of present illness. She was evaluated in the context of the global COVID-19 pandemic, which necessitated consideration that the patient might be at risk for infection with the SARS-CoV-2 virus that causes COVID-19. Institutional protocols and algorithms that pertain to the evaluation of patients at risk for COVID-19 are in a state of rapid change based on information released by regulatory bodies including the CDC and federal and state organizations. These policies and algorithms were followed during the patient's care in the ED.    Patient is a well-appearing 75 year old who comes in with some low blood pressures and not eating or drinking secondary to nausea, vomiting, diarrhea.  I suspect is most likely viral illness however given the significant number of episodes of diarrhea and patient's age and comorbidities will get GI stool studies as well as C. difficile.  We will also add on Covid swab.  CT scan was also done to evaluate for diverticulitis, colitis, perforation, abscess or other acute pathology.  Labs ordered to evaluate for Electra abnormalities, AKI.  Patient was given 2 L of fluid and some IV Zofran and some Imodium.  Patient was kept on the cardiac monitor  Notes of cardiac issues, labs are reassuring so for slightly elevated kidney function.  Reevaluated patient she is not having longer vomiting.  CT scan was negative.  There was evidence of fatty liver and her liver function tests were slightly elevated with patient's had prior cholecystectomy I do not think that there was any T bili elevation to suggest retained stone.  Patient handed off to oncoming team pending reevaluation after the rest of her fluids and seeing if she will need admission if her symptoms are  continuing versus potentially going home.     ____________________________________________   FINAL CLINICAL IMPRESSION(S) / ED DIAGNOSES   Final diagnoses:  AKI (acute kidney injury) ([place])  Gastroenteritis      MEDICATIONS GIVEN DURING THIS VISIT:  Medications  loperamide (IMODIUM) capsule 4 mg (has no administration in time range)  sodium chloride 0.9 % bolus 1,000 mL (1,000 mLs Intravenous New Bag/Given 08/02/20 2137)  ondansetron (ZOFRAN) injection 4 mg (4 mg Intravenous Given 08/02/20 2137)  iohexol (OMNIPAQUE) 300 MG/ML solution 100 mL (100 mLs Intravenous Contrast Given 08/02/20 2204)     ED Discharge Orders    None  Note:  This document was prepared using Dragon voice recognition software and may include unintentional dictation errors.   Vanessa Pekin, MD 08/02/20 2330

## 2020-08-03 DIAGNOSIS — Z888 Allergy status to other drugs, medicaments and biological substances status: Secondary | ICD-10-CM | POA: Diagnosis not present

## 2020-08-03 DIAGNOSIS — A09 Infectious gastroenteritis and colitis, unspecified: Secondary | ICD-10-CM | POA: Diagnosis not present

## 2020-08-03 DIAGNOSIS — F32A Depression, unspecified: Secondary | ICD-10-CM | POA: Diagnosis present

## 2020-08-03 DIAGNOSIS — Z8551 Personal history of malignant neoplasm of bladder: Secondary | ICD-10-CM | POA: Diagnosis not present

## 2020-08-03 DIAGNOSIS — Z961 Presence of intraocular lens: Secondary | ICD-10-CM | POA: Diagnosis present

## 2020-08-03 DIAGNOSIS — I9589 Other hypotension: Secondary | ICD-10-CM | POA: Diagnosis not present

## 2020-08-03 DIAGNOSIS — K219 Gastro-esophageal reflux disease without esophagitis: Secondary | ICD-10-CM | POA: Diagnosis present

## 2020-08-03 DIAGNOSIS — Z9842 Cataract extraction status, left eye: Secondary | ICD-10-CM | POA: Diagnosis not present

## 2020-08-03 DIAGNOSIS — E861 Hypovolemia: Secondary | ICD-10-CM

## 2020-08-03 DIAGNOSIS — Z882 Allergy status to sulfonamides status: Secondary | ICD-10-CM | POA: Diagnosis not present

## 2020-08-03 DIAGNOSIS — K529 Noninfective gastroenteritis and colitis, unspecified: Secondary | ICD-10-CM | POA: Diagnosis not present

## 2020-08-03 DIAGNOSIS — H53123 Transient visual loss, bilateral: Secondary | ICD-10-CM | POA: Diagnosis present

## 2020-08-03 DIAGNOSIS — E785 Hyperlipidemia, unspecified: Secondary | ICD-10-CM

## 2020-08-03 DIAGNOSIS — Z8673 Personal history of transient ischemic attack (TIA), and cerebral infarction without residual deficits: Secondary | ICD-10-CM | POA: Diagnosis not present

## 2020-08-03 DIAGNOSIS — K760 Fatty (change of) liver, not elsewhere classified: Secondary | ICD-10-CM | POA: Diagnosis present

## 2020-08-03 DIAGNOSIS — I10 Essential (primary) hypertension: Secondary | ICD-10-CM | POA: Diagnosis present

## 2020-08-03 DIAGNOSIS — N179 Acute kidney failure, unspecified: Secondary | ICD-10-CM | POA: Diagnosis not present

## 2020-08-03 DIAGNOSIS — Z955 Presence of coronary angioplasty implant and graft: Secondary | ICD-10-CM | POA: Diagnosis not present

## 2020-08-03 DIAGNOSIS — Z9049 Acquired absence of other specified parts of digestive tract: Secondary | ICD-10-CM | POA: Diagnosis not present

## 2020-08-03 DIAGNOSIS — Z20822 Contact with and (suspected) exposure to covid-19: Secondary | ICD-10-CM | POA: Diagnosis present

## 2020-08-03 DIAGNOSIS — A080 Rotaviral enteritis: Secondary | ICD-10-CM | POA: Diagnosis present

## 2020-08-03 DIAGNOSIS — Z79899 Other long term (current) drug therapy: Secondary | ICD-10-CM | POA: Diagnosis not present

## 2020-08-03 DIAGNOSIS — E871 Hypo-osmolality and hyponatremia: Secondary | ICD-10-CM | POA: Diagnosis present

## 2020-08-03 DIAGNOSIS — Z91013 Allergy to seafood: Secondary | ICD-10-CM | POA: Diagnosis not present

## 2020-08-03 DIAGNOSIS — Z885 Allergy status to narcotic agent status: Secondary | ICD-10-CM | POA: Diagnosis not present

## 2020-08-03 DIAGNOSIS — E119 Type 2 diabetes mellitus without complications: Secondary | ICD-10-CM | POA: Diagnosis present

## 2020-08-03 DIAGNOSIS — Z9841 Cataract extraction status, right eye: Secondary | ICD-10-CM | POA: Diagnosis not present

## 2020-08-03 DIAGNOSIS — I2510 Atherosclerotic heart disease of native coronary artery without angina pectoris: Secondary | ICD-10-CM | POA: Diagnosis present

## 2020-08-03 LAB — RESP PANEL BY RT-PCR (FLU A&B, COVID) ARPGX2
Influenza A by PCR: NEGATIVE
Influenza B by PCR: NEGATIVE
SARS Coronavirus 2 by RT PCR: NEGATIVE

## 2020-08-03 LAB — GASTROINTESTINAL PANEL BY PCR, STOOL (REPLACES STOOL CULTURE)

## 2020-08-03 LAB — URINALYSIS, COMPLETE (UACMP) WITH MICROSCOPIC
Bacteria, UA: NONE SEEN
Bilirubin Urine: NEGATIVE
Glucose, UA: NEGATIVE mg/dL
Hgb urine dipstick: NEGATIVE
Ketones, ur: 20 mg/dL — AB
Nitrite: NEGATIVE
Protein, ur: NEGATIVE mg/dL
Specific Gravity, Urine: 1.038 — ABNORMAL HIGH (ref 1.005–1.030)
pH: 5 (ref 5.0–8.0)

## 2020-08-03 LAB — GLUCOSE, CAPILLARY
Glucose-Capillary: 76 mg/dL (ref 70–99)
Glucose-Capillary: 78 mg/dL (ref 70–99)
Glucose-Capillary: 85 mg/dL (ref 70–99)
Glucose-Capillary: 79 mg/dL (ref 70–99)

## 2020-08-03 LAB — CBC
HCT: 37.8 % (ref 36.0–46.0)
Hemoglobin: 12.1 g/dL (ref 12.0–15.0)
MCH: 31.3 pg (ref 26.0–34.0)
MCHC: 32 g/dL (ref 30.0–36.0)
MCV: 97.7 fL (ref 80.0–100.0)
Platelets: 194 10*3/uL (ref 150–400)
RBC: 3.87 MIL/uL (ref 3.87–5.11)
RDW: 12.8 % (ref 11.5–15.5)
WBC: 6.5 10*3/uL (ref 4.0–10.5)
nRBC: 0 % (ref 0.0–0.2)

## 2020-08-03 LAB — HEMOGLOBIN A1C
Hgb A1c MFr Bld: 6.7 % — ABNORMAL HIGH (ref 4.8–5.6)
Mean Plasma Glucose: 145.59 mg/dL

## 2020-08-03 LAB — TROPONIN I (HIGH SENSITIVITY): Troponin I (High Sensitivity): 4 ng/L (ref <18)

## 2020-08-03 LAB — C DIFFICILE QUICK SCREEN W PCR REFLEX
C Diff antigen: NEGATIVE
C Diff interpretation: NOT DETECTED
C Diff toxin: NEGATIVE

## 2020-08-03 LAB — BASIC METABOLIC PANEL
Anion gap: 7 (ref 5–15)
BUN: 23 mg/dL (ref 8–23)
CO2: 20 mmol/L — ABNORMAL LOW (ref 22–32)
Calcium: 8.2 mg/dL — ABNORMAL LOW (ref 8.9–10.3)
Chloride: 109 mmol/L (ref 98–111)
Creatinine, Ser: 0.93 mg/dL (ref 0.44–1.00)
GFR, Estimated: >60 mL/min (ref >60)
Glucose, Bld: 82 mg/dL (ref 70–99)
Potassium: 3.6 mmol/L (ref 3.5–5.1)
Sodium: 136 mmol/L (ref 135–145)

## 2020-08-03 MED ORDER — POTASSIUM CHLORIDE IN NACL 20-0.9 MEQ/L-% IV SOLN
INTRAVENOUS | Status: DC
  Administered 2020-08-05: 1000 mL via INTRAVENOUS
  Filled 2020-08-03 (×8): qty 1000

## 2020-08-03 MED ORDER — ENSURE ENLIVE PO LIQD
237.0000 mL | Freq: Two times a day (BID) | ORAL | Status: DC
  Administered 2020-08-03 – 2020-08-05 (×2): 237 mL via ORAL

## 2020-08-03 MED ORDER — ESCITALOPRAM OXALATE 10 MG PO TABS
10.0000 mg | ORAL_TABLET | Freq: Every day | ORAL | Status: DC
  Administered 2020-08-03 – 2020-08-04 (×2): 10 mg via ORAL
  Filled 2020-08-03 (×3): qty 1

## 2020-08-03 MED ORDER — ONDANSETRON HCL 4 MG/2ML IJ SOLN: 4.0000 mg | Freq: Four times a day (QID) | INTRAMUSCULAR | Status: DC | PRN

## 2020-08-03 MED ORDER — ATORVASTATIN CALCIUM 20 MG PO TABS
40.0000 mg | ORAL_TABLET | Freq: Every evening | ORAL | Status: DC
  Administered 2020-08-03 – 2020-08-04 (×2): 40 mg via ORAL
  Filled 2020-08-03 (×2): qty 2

## 2020-08-03 MED ORDER — CARVEDILOL 25 MG PO TABS
25.0000 mg | ORAL_TABLET | Freq: Two times a day (BID) | ORAL | Status: DC
Start: 2020-08-03 — End: 2020-08-03

## 2020-08-03 MED ORDER — ADULT MULTIVITAMIN W/MINERALS CH
1.0000 | ORAL_TABLET | Freq: Every day | ORAL | Status: DC
  Administered 2020-08-03 – 2020-08-05 (×3): 1 via ORAL
  Filled 2020-08-03 (×4): qty 1

## 2020-08-03 MED ORDER — TRAZODONE HCL 50 MG PO TABS
25.0000 mg | ORAL_TABLET | Freq: Every evening | ORAL | Status: DC | PRN
  Administered 2020-08-03 – 2020-08-04 (×2): 25 mg via ORAL
  Filled 2020-08-03 (×2): qty 1

## 2020-08-03 MED ORDER — PANTOPRAZOLE SODIUM 40 MG PO TBEC
40.0000 mg | DELAYED_RELEASE_TABLET | Freq: Every day | ORAL | Status: DC
  Administered 2020-08-04 – 2020-08-05 (×2): 40 mg via ORAL
  Filled 2020-08-03 (×2): qty 1

## 2020-08-03 MED ORDER — ENOXAPARIN SODIUM 40 MG/0.4ML ~~LOC~~ SOLN
40.0000 mg | SUBCUTANEOUS | Status: DC
  Administered 2020-08-03 – 2020-08-05 (×3): 40 mg via SUBCUTANEOUS
  Filled 2020-08-03 (×3): qty 0.4

## 2020-08-03 MED ORDER — RISAQUAD PO CAPS
1.0000 | ORAL_CAPSULE | Freq: Every day | ORAL | Status: DC
  Administered 2020-08-03 – 2020-08-05 (×3): 1 via ORAL
  Filled 2020-08-03 (×3): qty 1

## 2020-08-03 MED ORDER — ONDANSETRON HCL 4 MG PO TABS: 4.0000 mg | ORAL_TABLET | Freq: Four times a day (QID) | ORAL | Status: DC | PRN

## 2020-08-03 MED ORDER — NITROGLYCERIN 0.4 MG SL SUBL: 0.4000 mg | SUBLINGUAL_TABLET | SUBLINGUAL | Status: DC | PRN

## 2020-08-03 MED ORDER — METOCLOPRAMIDE HCL 5 MG/ML IJ SOLN
10.0000 mg | Freq: Three times a day (TID) | INTRAMUSCULAR | Status: DC
  Administered 2020-08-03 – 2020-08-04 (×8): 10 mg via INTRAVENOUS
  Filled 2020-08-03 (×9): qty 2

## 2020-08-03 MED ORDER — PANTOPRAZOLE SODIUM 40 MG IV SOLR
40.0000 mg | Freq: Two times a day (BID) | INTRAVENOUS | Status: DC
  Administered 2020-08-03: 40 mg via INTRAVENOUS
  Filled 2020-08-03: qty 40

## 2020-08-03 MED ORDER — ACETAMINOPHEN 650 MG RE SUPP: 650.0000 mg | Freq: Four times a day (QID) | RECTAL | Status: DC | PRN

## 2020-08-03 MED ORDER — ACETAMINOPHEN 325 MG PO TABS: 650.0000 mg | ORAL_TABLET | Freq: Four times a day (QID) | ORAL | Status: DC | PRN

## 2020-08-03 MED ORDER — ONDANSETRON 4 MG PO TBDP: 4.0000 mg | ORAL_TABLET | Freq: Three times a day (TID) | ORAL | Status: DC | PRN

## 2020-08-03 MED ORDER — AMLODIPINE BESYLATE 5 MG PO TABS
5.0000 mg | ORAL_TABLET | Freq: Every day | ORAL | Status: DC
  Administered 2020-08-03 – 2020-08-05 (×3): 5 mg via ORAL
  Filled 2020-08-03 (×3): qty 1

## 2020-08-03 MED ORDER — INSULIN ASPART 100 UNIT/ML ~~LOC~~ SOLN: 0.0000 [IU] | Freq: Three times a day (TID) | SUBCUTANEOUS | Status: DC

## 2020-08-03 NOTE — Progress Notes (Signed)
Brief hospitalist update note.  This is a same day nonbillable note. Please see same-day H&P for full billable details.  Briefly, this is a 75 year old female who presents for several days of diarrhea associated with nausea vomiting and poor p.o. intake.  Results of GI PCR significant for rotavirus positivity.  C. difficile and all other infectious panel negative.  Plan: Aggressive IV fluids Antiemetics as needed Pain control as needed Avoid antidiarrheals Daily probiotic Daily protein supplementation Discharge once diarrhea resolved and patient tolerating p.o., possibly within 24 hours  Ralene Muskrat MD

## 2020-08-03 NOTE — ED Notes (Signed)
Able to catch more stool sample, sent to lab if needed

## 2020-08-03 NOTE — ED Notes (Signed)
Pt has had 3 loose bm since fluids completed, pt states feels worse, md aware and admission order placed.

## 2020-08-03 NOTE — Plan of Care (Signed)

## 2020-08-03 NOTE — H&P (Addendum)
[place]   PATIENT NAME: Meagan Osborne    MR#:  [id number]  DATE OF BIRTH:  1945-06-23  DATE OF ADMISSION:  08/02/2020  PRIMARY CARE PHYSICIAN: Dion Body, MD   Patient is coming from: Home  REQUESTING/REFERRING PHYSICIAN: Rudene Re, MD CHIEF COMPLAINT:   Chief Complaint  Patient presents with  . Weakness  Recurrent nausea and vomiting with diarrhea.  HISTORY OF PRESENT ILLNESS:  Meagan Osborne is a 75 y.o. Caucasian female with medical history significant for multiple medical problems that are mentioned below including type 2 diabetes mellitus, hypertension, dyslipidemia, coronary artery disease status post PCI and 2 stents, GERD as well as CVA, presented to the emergency room with acute onset of recurrent nausea and vomiting with diarrhea for the last 3 days with subsequent generalized weakness. She has been having loose and watery bowel movement.  She denied any bilious vomitus or hematemesis.  No melena or bright red bleeding per rectum.  She denied taking any recent antibiotics.  She denied any sick exposure.  She and her husband had her Covid vaccines and booster.  She admitted to right upper quadrant abdominal pain.  No fever or chills.  She called her primary care physician and was given as needed Zofran disintegrating tablets on Thursday.  She also was called and antidiarrheal earlier yesterday.  In the ER she still had diarrhea after Imodium.  No dysuria, oliguria or hematuria or flank pain.  No bleeding diathesis.  No chest pain or dyspnea or cough or wheezing.  ED Course: When she came to the ER vital signs were within normal.  Briefly blood pressure was down to 98/48 and with hydration came up to 132/57 and later 106/46 with a heart rate of 58.  BMP revealed mild hyponatremia 134 and borderline potassium of 3.6, BUN of 28 and creatinine of 1.58 compared to 13/0.52 in the distant past.  LFTs showed elevated AST of 85 and ALT of 92 EKG as reviewed by me :  showed normal sinus rhythm with rate of 66 with T wave inversion in V1 through V4 and borderline repolarization abnormality. Imaging: Abdominal pelvic CT scan revealed fatty liver, diverticulosis without diverticulitis, chronic T12 compression deformity and no renal calculi or obstructive changes.  The patient was given 4 mg of IV Zofran and 4 mg of p.o. Imodium as well as 2 L bolus of IV normal saline.  She will be admitted to an observation medical bed for further evaluation and management. PAST MEDICAL HISTORY:   Past Medical History:  Diagnosis Date  . Allergic genetic state   . Anxiety   . Arthritis   . B12 deficiency   . Bladder neoplasm   . Borderline diabetes   . Cancer (HCC)    BLADDER CANCER  . Carpal tunnel syndrome   . Cataracts, bilateral   . Cerebrovascular disease   . Chronic back pain   . Coronary artery disease    S/P  DES X2 TO MLAD  . Diabetes mellitus without complication (HCC)    borderline  . Difficult intubation    pt states was told she has a crooked airway-- REFER TO ANESTHESIA  RECORD FROM 06-07-2011 SURG. AT [facility]-- GLIDESCOPE USED WITHOUT ISSUES  . Diverticulosis   . Fibrocystic breast disease   . GERD (gastroesophageal reflux disease)   . H/O hiatal hernia   . 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 Hyperlipidemia   . Hypertension   .  Multilevel spondylosis   . Nocturia   . Osteoporosis   . PONV (postoperative nausea and vomiting)   . Psoriasis   . PVC's (premature ventricular contractions)   . Retroperitoneal hematoma    PER CT  . Right shoulder pain    S/P   ROTATOR CUFF REPAIR 10/  2016  . S/P drug eluting coronary stent placement   . Transient visual loss of both eyes   . Urinary frequency     PAST SURGICAL HISTORY:   Past Surgical History:  Procedure Laterality Date  . APPENDECTOMY    . BACK SURGERY    . BELPHAROPTOSIS REPAIR Bilateral 2014  . CARDIAC CATHETERIZATION  09-17-2008   [facility]   20% mRCA, 20% ostial  RCA, 10% OM2,  patent previous stents, ef 68%  . CARDIAC CATHETERIZATION  05-14-2010  [facility]   no change since previous cath  . CAROTID ENDARTERECTOMY Right 07-10-2009  . CATARACT EXTRACTION W/ INTRAOCULAR LENS  IMPLANT, BILATERAL  2015  . CHOLECYSTECTOMY  1990  . COLONOSCOPY    . COLONOSCOPY WITH PROPOFOL N/A 03/09/2017   Procedure: COLONOSCOPY WITH PROPOFOL;  Surgeon: Manya Silvas, MD;  Location[facility] ENDOSCOPY;  Service: Endoscopy;  Laterality: N/A;  . CORONARY ANGIOPLASTY WITH STENT PLACEMENT  10/ 2008  [place]   DES X1 to mLAD  . CORONARY ANGIOPLASTY WITH STENT PLACEMENT  09/ 2009  [facility]   DES X1  tp mLAD  . CYSTOSCOPY WITH BIOPSY N/A 05/16/2015   Procedure: CYSTOSCOPY WITH BIOPSY AND FULGERATION ;  Surgeon: Festus Aloe, MD;  Location: [facility];  Service: Urology;  Laterality: N/A;  . ESOPHAGOGASTRODUODENOSCOPY (EGD) WITH PROPOFOL N/A 04/07/2020   Procedure: ESOPHAGOGASTRODUODENOSCOPY (EGD) WITH PROPOFOL;  Surgeon: Toledo, Benay Pike, MD;  Location: [facility] ENDOSCOPY;  Service: Gastroenterology;  Laterality: N/A;  . EYE SURGERY    . HAND SURGERY Right 2005  . LUMBAR LAMINECTOMY/DECOMPRESSION MICRODISCECTOMY  06/07/2011   Procedure: LUMBAR LAMINECTOMY/DECOMPRESSION MICRODISCECTOMY 2 LEVELS;  Surgeon: Elaina Hoops, MD;  Location: [place] [facility]UR[facility] ORS;  Service: Neurosurgery;  Laterality: Left;   Lumbar Two-Three, Lumbar Three-Four Decompressive Lumbar Laminectomy  . SHOULDER ARTHROSCOPY WITH OPEN ROTATOR CUFF REPAIR Right 02/20/2015   Procedure: SHOULDER ARTHROSCOPY WITH OPEN ROTATOR CUFF REPAIR and decompression;  Surgeon: Corky Mull, MD;  Location: [facility] ORS;  Service: Orthopedics;  Laterality: Right;  . TONSILLECTOMY AND ADENOIDECTOMY  1956  . TRANSURETHRAL RESECTION OF BLADDER TUMOR WITH MITOMYCIN-C N/A 02/05/2014   Procedure: TRANSURETHRAL RESECTION OF BLADDER TUMOR ;  Surgeon: Festus Aloe, MD;  Location: [facility];  Service: Urology;  Laterality: N/A;  .  VAGINAL HYSTERECTOMY  1976    SOCIAL HISTORY:   Social History   Tobacco Use  . Smoking status: Former Smoker    Packs/day: 0.25    Years: 5.00    Pack years: 1.25    Types: Cigarettes    Quit date: 01/31/1999    Years since quitting: 21.5  . Smokeless tobacco: Never Used  Substance Use Topics  . Alcohol use: No    FAMILY HISTORY:   Family History  Problem Relation Age of Onset  . Anesthesia problems Neg Hx   . Hypotension Neg Hx   . Malignant hyperthermia Neg Hx   . Pseudochol deficiency Neg Hx     DRUG ALLERGIES:   Allergies  Allergen Reactions  . Shellfish Allergy Anaphylaxis and Hives    ALL SHELLFISH  . Aciphex [Rabeprazole Sodium] Other (See Comments)    migrianes  . Biaxin [  Clarithromycin] Hives and Swelling  . Codeine Other (See Comments)    insomnia  . Colchicine Other (See Comments)    UNKNOWN  . Evista [Raloxifene]   . Levaquin [Levofloxacin In D5w] Other (See Comments)    insomnia  . Losartan Swelling  . Myrbetriq [Mirabegron] Other (See Comments)    DID NOT WORK  . Norvasc [Amlodipine Besylate] Swelling  . Prevacid [Lansoprazole] Other (See Comments)    "it didn't work"  . Roxicodone [Oxycodone] Nausea And Vomiting    SEVERE  . Sulfa Antibiotics Hives  . Tetanus Toxoids Other (See Comments)    Red, swelling  . Zoloft [Sertraline Hcl] Other (See Comments)    Deep depression  . Amoxicillin Rash    REVIEW OF SYSTEMS:   ROS As per history of present illness. All pertinent systems were reviewed above. Constitutional, HEENT, cardiovascular, respiratory, GI, GU, musculoskeletal, neuro, psychiatric, endocrine, integumentary and hematologic systems were reviewed and are otherwise negative/unremarkable except for positive findings mentioned above in the HPI.   MEDICATIONS AT HOME:   Prior to Admission medications   Medication Sig Start Date End Date Taking? Authorizing Provider  acetaminophen (TYLENOL) 500 MG tablet Take 1,000 mg by mouth  every 6 (six) hours as needed.   Yes [provider]  amLODipine (NORVASC) 5 MG tablet Take 5 mg by mouth daily. 07/17/20  Yes [provider]  aspirin EC 81 MG tablet Take 81 mg by mouth daily.   Yes [provider]  atorvastatin (LIPITOR) 40 MG tablet Take 40 mg by mouth every evening.    Yes [provider]  benazepril (LOTENSIN) 40 MG tablet Take 40 mg by mouth every morning.    Yes [provider]  carvedilol (COREG) 25 MG tablet Take 25 mg by mouth 2 (two) times daily with a meal.   Yes [provider]  escitalopram (LEXAPRO) 10 MG tablet Take 10 mg by mouth at bedtime. 07/17/20  Yes [provider]  esomeprazole (NEXIUM) 40 MG capsule Take 40 mg by mouth at bedtime.    Yes [provider]  fluocinonide (LIDEX) 0.05 % external solution Apply 1 application topically daily as needed. 07/21/20  Yes Ralene Bathe, MD  fluticasone [facility]) 50 MCG/ACT nasal spray Place 2 sprays into both nostrils daily as needed.   Yes [provider]  Multiple Vitamins-Minerals (MULTIVITAMINS THER. W/MINERALS) TABS Take 1 tablet by mouth daily.   Yes [provider]  nitroGLYCERIN (NITROSTAT) 0.4 MG SL tablet Place 0.4 mg under the tongue every 5 (five) minutes as needed. For chest pain   Yes [provider]  ondansetron (ZOFRAN-ODT) 4 MG disintegrating tablet 4 mg every 8 (eight) hours as needed. 08/01/20  Yes [provider]  citalopram (CELEXA) 10 MG tablet Take 10 mg by mouth every evening. Patient not taking: No sig reported    [provider]  traMADol (ULTRAM) 50 MG tablet Take 1 tablet (50 mg total) by mouth every 8 (eight) hours as needed. Patient not taking: No sig reported 10/07/19   Coral Spikes, DO      VITAL SIGNS:  Blood pressure (!) 132/57, pulse 64, temperature 98.4 F (36.9 C), temperature source Oral, resp. rate 15, height 5' (1.524 m), weight 68.9 kg, SpO2 98 %.  PHYSICAL  EXAMINATION:  Physical Exam  GENERAL:  75 y.o.-year-old Caucasian female patient lying in the bed with no acute distress.  EYES: Pupils equal, round, reactive to light and accommodation. No scleral icterus. Extraocular muscles  intact.  HEENT: Head atraumatic, normocephalic. Oropharynx and nasopharynx clear.  NECK:  Supple, no jugular venous distention. No thyroid enlargement, no tenderness.  LUNGS: Normal breath sounds bilaterally, no wheezing, rales,rhonchi or crepitation. No use of accessory muscles of respiration.  CARDIOVASCULAR: Regular rate and rhythm, S1, S2 normal. No murmurs, rubs, or gallops.  ABDOMEN: Soft, nondistended, nontender. Bowel sounds present. No organomegaly or mass.  EXTREMITIES: No pedal edema, cyanosis, or clubbing.  NEUROLOGIC: Cranial nerves II through XII are intact. Muscle strength 5/5 in all extremities. Sensation intact. Gait not checked.  PSYCHIATRIC: The patient is alert and oriented x 3.  Normal affect and good eye contact. SKIN: No obvious rash, lesion, or ulcer.   LABORATORY PANEL:   CBC Recent Labs  Lab 08/02/20 2139  WBC 6.7  HGB 14.3  HCT 43.7  PLT 236   ------------------------------------------------------------------------------------------------------------------  Chemistries  Recent Labs  Lab 08/02/20 2139  NA 134*  K 3.6  CL 104  CO2 18*  GLUCOSE 116*  BUN 28*  CREATININE 1.58*  CALCIUM 9.0  AST 85*  ALT 92*  ALKPHOS 87  BILITOT 0.9   ------------------------------------------------------------------------------------------------------------------  Cardiac Enzymes No results for input(s): TROPONINI in the last 168 hours. ------------------------------------------------------------------------------------------------------------------  RADIOLOGY:  CT ABDOMEN PELVIS W CONTRAST  Result Date: 08/02/2020 CLINICAL DATA:  Abdominal distension and pain EXAM: CT ABDOMEN AND PELVIS WITH CONTRAST TECHNIQUE: Multidetector CT  imaging of the abdomen and pelvis was performed using the standard protocol following bolus administration of intravenous contrast. CONTRAST:  163mL OMNIPAQUE IOHEXOL 300 MG/ML  SOLN COMPARISON:  04/16/2015 FINDINGS: Lower chest: No acute abnormality. Hepatobiliary: Fatty infiltration of the liver is noted. The gallbladder has been surgically removed. Pancreas: Unremarkable. No pancreatic ductal dilatation or surrounding inflammatory changes. Spleen: Normal in size without focal abnormality. Adrenals/Urinary Tract: Adrenal glands are within normal limits. Kidneys demonstrate no renal calculi or urinary tract obstructive changes. Normal excretion of contrast is noted on delayed images. Ureters are within normal limits. The bladder is decompressed. Stomach/Bowel: Scattered diverticular change of the colon is seen without evidence of diverticulitis. The appendix has been surgically removed. Small bowel and stomach appear within normal limits. Vascular/Lymphatic: Aortic atherosclerosis. No enlarged abdominal or pelvic lymph nodes. Reproductive: Status post hysterectomy. No adnexal masses. Other: No abdominal wall hernia or abnormality. No abdominopelvic ascites. Musculoskeletal: T12 compression deformity is noted chronic in appearance stable from prior MRI from 2020. IMPRESSION: Fatty liver. No renal calculi or obstructive changes are noted. Diverticulosis without diverticulitis. Chronic T12 compression deformity. Electronically Signed   By: Inez Catalina M.D.   On: 08/02/2020 22:23      IMPRESSION AND PLAN:  Active Problems:   Acute gastroenteritis  1.  Acute gastroenteritis, viral versus infectious etiology. -The patient will be admitted on observation medical bed. -We will continue hydration with IV normal saline. -A stool sample was obtained and sent for stool pathogens. -The patient had no recent IV antibiotics and her abdominal exam was fairly benign.  She had no fever or leukocytosis.  2. Acute  kidney injury, likely prerenal due to hypovolemia and dehydration. -The patient will be hydrated with IV normal saline and will follow her BMP. -We will hold off nephrotoxins.  3.  Hypotension. -This is clearly secondary to hypovolemia. -She will be hydrated and her blood pressure will be monitored.  It has been responding to IV fluids. -Her antihypertensives will be held off for hypotension.  4.  Dyslipidemia. -We will continue statin therapy.  5.  Type 2 diabetes mellitus,  apparently diet managed. -She will be placed on supplement coverage with NovoLog.  6.  History of hypertension. -We will continue Norvasc with holding parameters. -Her benazepril will be held off given acute kidney injury. -Her Coreg will be held off given her bradycardia.  7.  Depression. -We will continue her Celexa.  8.  GERD. -She will be placed on IV PPI therapy  DVT prophylaxis: Lovenox. Code Status: full code. Family Communication:  The plan of care was discussed in details with the patient (and her husband who was with her in the room). I answered all questions. The patient agreed to proceed with the above mentioned plan. Further management will depend upon hospital course. Disposition Plan: Back to previous home environment Consults called: none. All the records are reviewed and case discussed with ED provider.  Status is: Observation  The patient remains OBS appropriate and will d/c before 2 midnights.  Dispo: The patient is from: Home              Anticipated d/c is to: Home              Patient currently is not medically stable to d/c.   Difficult to place patient No  TOTAL TIME TAKING CARE OF THIS PATIENT: 55 minutes.    Christel Mormon M.D on 08/03/2020 at 2:28 AM  [facility]   From 7 PM-7 AM, contact night-coverage [email or web address]  CC: Primary care physician; Dion Body, MD

## 2020-08-03 NOTE — ED Notes (Signed)
Pt assisted to bathroom

## 2020-08-03 NOTE — ED Provider Notes (Signed)
Patient with 3 bowel movements in the emergency room.  Received second liter fluid.  Continues to feel very dizzy and unsteady when standing and does not feel safe going home.  Will discuss with hospitalist for admission.   Alfred Levins, [place], MD 08/03/20 [phone]

## 2020-08-04 DIAGNOSIS — A09 Infectious gastroenteritis and colitis, unspecified: Secondary | ICD-10-CM

## 2020-08-04 LAB — GLUCOSE, CAPILLARY
Glucose-Capillary: 88 mg/dL (ref 70–99)
Glucose-Capillary: 95 mg/dL (ref 70–99)
Glucose-Capillary: 87 mg/dL (ref 70–99)
Glucose-Capillary: 93 mg/dL (ref 70–99)

## 2020-08-04 LAB — CBC WITH DIFFERENTIAL/PLATELET
Abs Immature Granulocytes: 0 10*3/uL (ref 0.00–0.07)
Basophils Absolute: 0 10*3/uL (ref 0.0–0.1)
Basophils Relative: 1 %
Eosinophils Absolute: 0.1 10*3/uL (ref 0.0–0.5)
Eosinophils Relative: 3 %
HCT: 38.2 % (ref 36.0–46.0)
Hemoglobin: 12.5 g/dL (ref 12.0–15.0)
Immature Granulocytes: 0 %
Lymphocytes Relative: 42 %
Lymphs Abs: 1.8 10*3/uL (ref 0.7–4.0)
MCH: 31.6 pg (ref 26.0–34.0)
MCHC: 32.7 g/dL (ref 30.0–36.0)
MCV: 96.5 fL (ref 80.0–100.0)
Monocytes Absolute: 0.6 10*3/uL (ref 0.1–1.0)
Monocytes Relative: 14 %
Neutro Abs: 1.7 10*3/uL (ref 1.7–7.7)
Neutrophils Relative %: 40 %
Platelets: 179 10*3/uL (ref 150–400)
RBC: 3.96 MIL/uL (ref 3.87–5.11)
RDW: 12.4 % (ref 11.5–15.5)
WBC: 4.2 10*3/uL (ref 4.0–10.5)
nRBC: 0 % (ref 0.0–0.2)

## 2020-08-04 LAB — BASIC METABOLIC PANEL
Anion gap: 6 (ref 5–15)
BUN: 9 mg/dL (ref 8–23)
CO2: 21 mmol/L — ABNORMAL LOW (ref 22–32)
Calcium: 8.2 mg/dL — ABNORMAL LOW (ref 8.9–10.3)
Chloride: 113 mmol/L — ABNORMAL HIGH (ref 98–111)
Creatinine, Ser: 0.58 mg/dL (ref 0.44–1.00)
GFR, Estimated: >60 mL/min (ref >60)
Glucose, Bld: 99 mg/dL (ref 70–99)
Potassium: 4.1 mmol/L (ref 3.5–5.1)
Sodium: 140 mmol/L (ref 135–145)

## 2020-08-04 LAB — MAGNESIUM: Magnesium: 1.9 mg/dL (ref 1.7–2.4)

## 2020-08-04 MED ORDER — PSYLLIUM 95 % PO PACK
1.0000 | PACK | Freq: Every day | ORAL | Status: DC
  Administered 2020-08-04 – 2020-08-05 (×2): 1 via ORAL
  Filled 2020-08-04 (×2): qty 1

## 2020-08-04 MED ORDER — CARVEDILOL 12.5 MG PO TABS
12.5000 mg | ORAL_TABLET | Freq: Two times a day (BID) | ORAL | Status: DC
  Administered 2020-08-04 – 2020-08-05 (×2): 12.5 mg via ORAL
  Filled 2020-08-04 (×2): qty 1

## 2020-08-04 NOTE — Progress Notes (Signed)
PROGRESS NOTE    Meagan Osborne  TIW:[id number] DOB: 12-Feb-1946 DOA: 08/02/2020 PCP: Dion Body, MD  Brief Narrative:  75 year old female who presents for several days of diarrhea associated with nausea vomiting and poor p.o. intake.  Results of GI PCR significant for rotavirus positivity.  C. difficile and all other infectious panel negative.  Remains hemodynamically stable.  Still with significant diarrhea.  Electrolytes within normal limits.  Afebrile with normal white blood cell count   Assessment & Plan:   Active Problems:   Acute gastroenteritis   Acute infectious nonbacterial gastroenteritis  Acute diarrhea in the setting of rotavirus infection Patient still with significant watery stools Hemodynamically stable with stable electrolytes Plan: Continue aggressive IV fluids Advance diet cautiously to soft As needed antiemetics As needed pain control Add daily psyllium Daily probiotic Daily protein supplement Discharge once diarrhea resolved patient tolerating p.o.  Acute kidney injury, resolved Likely secondary to prerenal azotemia Continue IV fluids as above Avoid nonessential nephrotoxins  Hyperlipidemia Continue statin  Type 2 diabetes mellitus Diet controlled at home Supplemental NovoLog coverage  Hypertension Continue amlodipine Restart Coreg at half home dose, 12.5 mg twice daily Continue holding ACE inhibitor  Depression Continue home Celexa  GERD Oral PPI  DVT prophylaxis: Lovenox  Code Status: Full Family Communication: None today Disposition Plan: Status is: Inpatient   Remains inpatient appropriate because:Inpatient level of care appropriate due to severity of illness   Dispo: The patient is from: Home              Anticipated d/c is to: Home              Patient currently is not medically stable to d/c.   Difficult to place patient No  Persistent watery nonbloody diarrhea in the setting of rotavirus infection.  Discharge plan  pending possibly within 24 to 48 hours if diarrhea resolved.     Level of care: Med-Surg  Consultants:   None  Procedures:   None  Antimicrobials:   None   Subjective: Patient seen and examined.  Continues to endorse severe watery diarrhea.  No other complaints  Objective: Vitals:   08/03/20 1552 08/03/20 1953 08/04/20 0414 08/04/20 0800  BP: (!) 143/58 (!) 146/79 (!) 139/57 (!) 149/65  Pulse: 62 66 70 68  Resp: 16 17 16 18   Temp: 98.6 F (37 C) 98.2 F (36.8 C) 97.7 F (36.5 C) 98.5 F (36.9 C)  TempSrc:  Oral Oral   SpO2: 92% 94% 93% 95%  Weight:      Height:        Intake/Output Summary (Last 24 hours) at 08/04/2020 1121 Last data filed at 08/03/2020 1743 Gross per 24 hour  Intake 1000 ml  Output 200 ml  Net 800 ml   Filed Weights   08/02/20 2124 08/03/20 0410  Weight: 68.9 kg 66 kg    Examination:  General exam: Appears calm and comfortable  Respiratory system: Clear to auscultation. Respiratory effort normal. Cardiovascular system: S1 & S2 heard, RRR. No JVD, murmurs, rubs, gallops or clicks. No pedal edema. Gastrointestinal system: Soft, nondistended, mild tender to palpation, normal bowel sounds Central nervous system: Alert and oriented. No focal neurological deficits. Extremities: Symmetric 5 x 5 power. Skin: No rashes, lesions or ulcers Psychiatry: Judgement and insight appear normal. Mood & affect appropriate.     Data Reviewed: I have personally reviewed following labs and imaging studies  CBC: Recent Labs  Lab 08/02/20 2139 08/03/20 0457 08/04/20 0901  WBC 6.7 6.5  4.2  NEUTROABS 3.6  --  1.7  HGB 14.3 12.1 12.5  HCT 43.7 37.8 38.2  MCV 96.3 97.7 96.5  PLT 236 194 062   Basic Metabolic Panel: Recent Labs  Lab 08/02/20 2139 08/03/20 0457 08/04/20 0901  NA 134* 136 140  K 3.6 3.6 4.1  CL 104 109 113*  CO2 18* 20* 21*  GLUCOSE 116* 82 99  BUN 28* 23 9  CREATININE 1.58* 0.93 0.58  CALCIUM 9.0 8.2* 8.2*  MG  --   --   1.9   GFR: Estimated Creatinine Clearance: 51.5 mL/min (by C-G formula based on SCr of 0.58 mg/dL). Liver Function Tests: Recent Labs  Lab 08/02/20 2139  AST 85*  ALT 92*  ALKPHOS 87  BILITOT 0.9  PROT 6.9  ALBUMIN 4.3   Recent Labs  Lab 08/02/20 2139  LIPASE 36   No results for input(s): AMMONIA in the last 168 hours. Coagulation Profile: No results for input(s): INR, PROTIME in the last 168 hours. Cardiac Enzymes: No results for input(s): CKTOTAL, CKMB, CKMBINDEX, TROPONINI in the last 168 hours. BNP (last 3 results) No results for input(s): PROBNP in the last 8760 hours. HbA1C: Recent Labs    08/03/20 0457  HGBA1C 6.7*   CBG: Recent Labs  Lab 08/03/20 0734 08/03/20 1222 08/03/20 1556 08/03/20 2100 08/04/20 0735  GLUCAP 76 78 85 79 88   Lipid Profile: No results for input(s): CHOL, HDL, LDLCALC, TRIG, CHOLHDL, LDLDIRECT in the last 72 hours. Thyroid Function Tests: No results for input(s): TSH, T4TOTAL, FREET4, T3FREE, THYROIDAB in the last 72 hours. Anemia Panel: No results for input(s): VITAMINB12, FOLATE, FERRITIN, TIBC, IRON, RETICCTPCT in the last 72 hours. Sepsis Labs: No results for input(s): PROCALCITON, LATICACIDVEN in the last 168 hours.  Recent Results (from the past 240 hour(s))  C Difficile Quick Screen w PCR reflex     Status: None   Collection Time: 08/02/20  1:34 AM   Specimen: Stool  Result Value Ref Range Status   C Diff antigen NEGATIVE NEGATIVE Final   C Diff toxin NEGATIVE NEGATIVE Final   C Diff interpretation No C. difficile detected.  Final    Comment: Performed at [facility], [address] [place], [place] [address]  Gastrointestinal Panel by PCR , Stool     Status: Abnormal   Collection Time: 08/02/20  3:03 AM   Specimen: Stool  Result Value Ref Range Status   Campylobacter species NOT DETECTED NOT DETECTED Final   Plesimonas shigelloides NOT DETECTED NOT DETECTED Final   Salmonella species NOT DETECTED NOT  DETECTED Final   Yersinia enterocolitica NOT DETECTED NOT DETECTED Final   Vibrio species NOT DETECTED NOT DETECTED Final   Vibrio cholerae NOT DETECTED NOT DETECTED Final   Enteroaggregative E coli (EAEC) NOT DETECTED NOT DETECTED Final   Enteropathogenic E coli (EPEC) NOT DETECTED NOT DETECTED Final   Enterotoxigenic E coli (ETEC) NOT DETECTED NOT DETECTED Final   Shiga like toxin producing E coli (STEC) NOT DETECTED NOT DETECTED Final   Shigella/Enteroinvasive E coli (EIEC) NOT DETECTED NOT DETECTED Final   Cryptosporidium NOT DETECTED NOT DETECTED Final   Cyclospora cayetanensis NOT DETECTED NOT DETECTED Final   Entamoeba histolytica NOT DETECTED NOT DETECTED Final   Giardia lamblia NOT DETECTED NOT DETECTED Final   Adenovirus F40/41 NOT DETECTED NOT DETECTED Final   Astrovirus NOT DETECTED NOT DETECTED Final   Norovirus GI/GII NOT DETECTED NOT DETECTED Final   Rotavirus A DETECTED (A) NOT DETECTED Final  Sapovirus (I, II, IV, and V) NOT DETECTED NOT DETECTED Final    Comment: Performed at [facility], [address] [place], [place] [address]  Resp Panel by RT-PCR (Flu A&B, Covid) Nasopharyngeal Swab     Status: None   Collection Time: 08/02/20 11:27 PM   Specimen: Nasopharyngeal Swab; Nasopharyngeal(NP) swabs in vial transport medium  Result Value Ref Range Status   SARS Coronavirus 2 by RT PCR NEGATIVE NEGATIVE Final    Comment: (NOTE) SARS-CoV-2 target nucleic acids are NOT DETECTED.  The SARS-CoV-2 RNA is generally detectable in upper respiratory specimens during the acute phase of infection. The lowest concentration of SARS-CoV-2 viral copies this assay can detect is 138 copies/mL. A negative result does not preclude SARS-Cov-2 infection and should not be used as the sole basis for treatment or other patient management decisions. A negative result may occur with  improper specimen collection/handling, submission of specimen other than nasopharyngeal swab,  presence of viral mutation(s) within the areas targeted by this assay, and inadequate number of viral copies(<138 copies/mL). A negative result must be combined with clinical observations, patient history, and epidemiological information. The expected result is Negative.  Fact Sheet for Patients:  EntrepreneurPulse.com.au  Fact Sheet for Healthcare Providers:  IncredibleEmployment.be  This test is no t yet approved or cleared by the [place] FDA and  has been authorized for detection and/or diagnosis of SARS-CoV-2 by FDA under an Emergency Use Authorization (EUA). This EUA will remain  in effect (meaning this test can be used) for the duration of the COVID-19 declaration under Section 564(b)(1) of the Act, 21 U.S.C.section 360bbb-3(b)(1), unless the authorization is terminated  or revoked sooner.       Influenza A by PCR NEGATIVE NEGATIVE Final   Influenza B by PCR NEGATIVE NEGATIVE Final    Comment: (NOTE) The Xpert Xpress SARS-CoV-2/FLU/RSV plus assay is intended as an aid in the diagnosis of influenza from Nasopharyngeal swab specimens and should not be used as a sole basis for treatment. Nasal washings and aspirates are unacceptable for Xpert Xpress SARS-CoV-2/FLU/RSV testing.  Fact Sheet for Patients: EntrepreneurPulse.com.au  Fact Sheet for Healthcare Providers: IncredibleEmployment.be  This test is not yet approved or cleared by the [place] FDA and has been authorized for detection and/or diagnosis of SARS-CoV-2 by FDA under an Emergency Use Authorization (EUA). This EUA will remain in effect (meaning this test can be used) for the duration of the COVID-19 declaration under Section 564(b)(1) of the Act, 21 U.S.C. section 360bbb-3(b)(1), unless the authorization is terminated or revoked.  Performed at [facility], [address] [place], [place] [address]           Radiology Studies: CT ABDOMEN PELVIS W CONTRAST  Result Date: 08/02/2020 CLINICAL DATA:  Abdominal distension and pain EXAM: CT ABDOMEN AND PELVIS WITH CONTRAST TECHNIQUE: Multidetector CT imaging of the abdomen and pelvis was performed using the standard protocol following bolus administration of intravenous contrast. CONTRAST:  125mL OMNIPAQUE IOHEXOL 300 MG/ML  SOLN COMPARISON:  04/16/2015 FINDINGS: Lower chest: No acute abnormality. Hepatobiliary: Fatty infiltration of the liver is noted. The gallbladder has been surgically removed. Pancreas: Unremarkable. No pancreatic ductal dilatation or surrounding inflammatory changes. Spleen: Normal in size without focal abnormality. Adrenals/Urinary Tract: Adrenal glands are within normal limits. Kidneys demonstrate no renal calculi or urinary tract obstructive changes. Normal excretion of contrast is noted on delayed images. Ureters are within normal limits. The bladder is decompressed. Stomach/Bowel: Scattered diverticular change of the colon is seen  without evidence of diverticulitis. The appendix has been surgically removed. Small bowel and stomach appear within normal limits. Vascular/Lymphatic: Aortic atherosclerosis. No enlarged abdominal or pelvic lymph nodes. Reproductive: Status post hysterectomy. No adnexal masses. Other: No abdominal wall hernia or abnormality. No abdominopelvic ascites. Musculoskeletal: T12 compression deformity is noted chronic in appearance stable from prior MRI from 2020. IMPRESSION: Fatty liver. No renal calculi or obstructive changes are noted. Diverticulosis without diverticulitis. Chronic T12 compression deformity. Electronically Signed   By: Inez Catalina M.D.   On: 08/02/2020 22:23        Scheduled Meds: . acidophilus  1 capsule Oral Daily  . amLODipine  5 mg Oral Daily  . atorvastatin  40 mg Oral QPM  . enoxaparin (LOVENOX) injection  40 mg Subcutaneous Q24H  . escitalopram  10 mg Oral QHS  . feeding  supplement  237 mL Oral BID BM  . insulin aspart  0-9 Units Subcutaneous TID AC & HS  . metoCLOPramide (REGLAN) injection  10 mg Intravenous TID AC & HS  . multivitamin with minerals  1 tablet Oral Daily  . pantoprazole  40 mg Oral Daily  . psyllium  1 packet Oral Daily   Continuous Infusions: . 0.9 % NaCl with KCl 20 mEq / L 100 mL/hr at 08/03/20 0245     LOS: 1 day    Time spent: 25 minutes    Sidney Ace, MD [facility] Pager [phone]  If 7PM-7AM, please contact night-coverage 08/04/2020, 11:21 AM

## 2020-08-05 LAB — GLUCOSE, CAPILLARY
Glucose-Capillary: 115 mg/dL — ABNORMAL HIGH (ref 70–99)
Glucose-Capillary: 112 mg/dL — ABNORMAL HIGH (ref 70–99)

## 2020-08-05 MED ORDER — RISAQUAD PO CAPS: 1.0000 | ORAL_CAPSULE | Freq: Every day | ORAL | Status: DC

## 2020-08-05 MED ORDER — ONDANSETRON HCL 4 MG PO TABS: 4.0000 mg | ORAL_TABLET | Freq: Three times a day (TID) | ORAL | 0 refills | Status: DC | PRN

## 2020-08-05 NOTE — Discharge Summary (Signed)
Physician Discharge Summary  Meagan Osborne MAU:[id number] DOB: 1946/04/23 DOA: 08/02/2020  PCP: Dion Body, MD  Admit date: 08/02/2020 Discharge date: 08/05/2020  Admitted From: Home Disposition: Home  Recommendations for Outpatient Follow-up:  1. Follow up with PCP in 1-2 weeks  Home Health: No Equipment/Devices: None Discharge Condition: Stable CODE STATUS: Full Diet recommendation: Bland/soft Brief/Interim Summary: 75 year old female who presents for several days of diarrhea associated with nausea vomiting and poor p.o. intake. Results of GI PCR significant for rotavirus positivity. C. difficile and all other infectious panel negative.  Remains hemodynamically stable.  Still with significant diarrhea.  Electrolytes within normal limits.  Afebrile with normal white blood cell count  Diarrhea resolved on day of discharge.  Patient tolerating soft diet without issue.  Stable for discharge home at this time.  Recommend to maintain a soft/bland diet and Ensure adequate oral hydration until complete resolution of symptoms.  Discharge Diagnoses:  Active Problems:   Acute gastroenteritis   Acute infectious nonbacterial gastroenteritis  Acute diarrhea in the setting of rotavirus infection Patient still with significant watery stools Hemodynamically stable with stable electrolytes Diarrhea resolved at time of discharge Stable for discharge home No antibiotics Recommend bland diet Daily probiotic Adequate p.o. hydration  Acute kidney injury, resolved Likely secondary to prerenal azotemia Result the time of discharge  Hyperlipidemia Continue statin  Type 2 diabetes mellitus Diet controlled at home  Hypertension Resume amlodipine and Coreg at home doses Resume ACE inhibitor now that kidney function normal  Depression Continue home Celexa  GERD Oral PPI  Discharge Instructions  Discharge Instructions    Diet - low sodium heart healthy   Complete by:  As directed    Increase activity slowly   Complete by: As directed      Allergies as of 08/05/2020      Reactions   Shellfish Allergy Anaphylaxis, Hives   ALL SHELLFISH   Aciphex [rabeprazole Sodium] Other (See Comments)   migrianes   Biaxin [clarithromycin] Hives, Swelling   Codeine Other (See Comments)   insomnia   Colchicine Other (See Comments)   UNKNOWN   Evista [raloxifene]    Levaquin [levofloxacin In D5w] Other (See Comments)   insomnia   Losartan Swelling   Myrbetriq [mirabegron] Other (See Comments)   DID NOT WORK   Norvasc [amlodipine Besylate] Swelling   Prevacid [lansoprazole] Other (See Comments)   "it didn't work"   Roxicodone [oxycodone] Nausea And Vomiting   SEVERE   Sulfa Antibiotics Hives   Tetanus Toxoids Other (See Comments)   Red, swelling   Zoloft [sertraline Hcl] Other (See Comments)   Deep depression   Amoxicillin Rash      Medication List    STOP taking these medications   citalopram 10 MG tablet Commonly known as: CELEXA   traMADol 50 MG tablet Commonly known as: ULTRAM     TAKE these medications   acetaminophen 500 MG tablet Commonly known as: TYLENOL Take 1,000 mg by mouth every 6 (six) hours as needed.   acidophilus Caps capsule Take 1 capsule by mouth daily. Start taking on: August 06, 2020   amLODipine 5 MG tablet Commonly known as: NORVASC Take 5 mg by mouth daily.   aspirin EC 81 MG tablet Take 81 mg by mouth daily.   atorvastatin 40 MG tablet Commonly known as: LIPITOR Take 40 mg by mouth every evening.   benazepril 40 MG tablet Commonly known as: LOTENSIN Take 40 mg by mouth every morning.   carvedilol 25 MG tablet  Commonly known as: COREG Take 25 mg by mouth 2 (two) times daily with a meal.   escitalopram 10 MG tablet Commonly known as: LEXAPRO Take 10 mg by mouth at bedtime.   esomeprazole 40 MG capsule Commonly known as: NEXIUM Take 40 mg by mouth at bedtime.   fluocinonide 0.05 % external  solution Commonly known as: LIDEX Apply 1 application topically daily as needed.   fluticasone 50 MCG/ACT nasal spray Commonly known as: FLONASE Place 2 sprays into both nostrils daily as needed.   multivitamins ther. w/minerals Tabs tablet Take 1 tablet by mouth daily.   nitroGLYCERIN 0.4 MG SL tablet Commonly known as: NITROSTAT Place 0.4 mg under the tongue every 5 (five) minutes as needed. For chest pain   ondansetron 4 MG disintegrating tablet Commonly known as: ZOFRAN-ODT 4 mg every 8 (eight) hours as needed.   ondansetron 4 MG tablet Commonly known as: ZOFRAN Take 1 tablet (4 mg total) by mouth every 8 (eight) hours as needed for nausea or vomiting.       Follow-up Information    Dion Body, MD. Schedule an appointment as soon as possible for a visit in 1 week(s).   Specialty: Family Medicine Contact information: [address] [place] [address] [phone]              Allergies  Allergen Reactions  . Shellfish Allergy Anaphylaxis and Hives    ALL SHELLFISH  . Aciphex [Rabeprazole Sodium] Other (See Comments)    migrianes  . Biaxin [Clarithromycin] Hives and Swelling  . Codeine Other (See Comments)    insomnia  . Colchicine Other (See Comments)    UNKNOWN  . Evista [Raloxifene]   . Levaquin [Levofloxacin In D5w] Other (See Comments)    insomnia  . Losartan Swelling  . Myrbetriq [Mirabegron] Other (See Comments)    DID NOT WORK  . Norvasc [Amlodipine Besylate] Swelling  . Prevacid [Lansoprazole] Other (See Comments)    "it didn't work"  . Roxicodone [Oxycodone] Nausea And Vomiting    SEVERE  . Sulfa Antibiotics Hives  . Tetanus Toxoids Other (See Comments)    Red, swelling  . Zoloft [Sertraline Hcl] Other (See Comments)    Deep depression  . Amoxicillin Rash    Consultations:  None   Procedures/Studies: CT ABDOMEN PELVIS W CONTRAST  Result Date: 08/02/2020 CLINICAL DATA:  Abdominal distension and pain EXAM: CT  ABDOMEN AND PELVIS WITH CONTRAST TECHNIQUE: Multidetector CT imaging of the abdomen and pelvis was performed using the standard protocol following bolus administration of intravenous contrast. CONTRAST:  183mL OMNIPAQUE IOHEXOL 300 MG/ML  SOLN COMPARISON:  04/16/2015 FINDINGS: Lower chest: No acute abnormality. Hepatobiliary: Fatty infiltration of the liver is noted. The gallbladder has been surgically removed. Pancreas: Unremarkable. No pancreatic ductal dilatation or surrounding inflammatory changes. Spleen: Normal in size without focal abnormality. Adrenals/Urinary Tract: Adrenal glands are within normal limits. Kidneys demonstrate no renal calculi or urinary tract obstructive changes. Normal excretion of contrast is noted on delayed images. Ureters are within normal limits. The bladder is decompressed. Stomach/Bowel: Scattered diverticular change of the colon is seen without evidence of diverticulitis. The appendix has been surgically removed. Small bowel and stomach appear within normal limits. Vascular/Lymphatic: Aortic atherosclerosis. No enlarged abdominal or pelvic lymph nodes. Reproductive: Status post hysterectomy. No adnexal masses. Other: No abdominal wall hernia or abnormality. No abdominopelvic ascites. Musculoskeletal: T12 compression deformity is noted chronic in appearance stable from prior MRI from 2020. IMPRESSION: Fatty liver. No renal calculi or obstructive  changes are noted. Diverticulosis without diverticulitis. Chronic T12 compression deformity. Electronically Signed   By: Inez Catalina M.D.   On: 08/02/2020 22:23   (Echo, Carotid, EGD, Colonoscopy, ERCP)    Subjective: Patient seen and examined the day of discharge.  Exam much improved.  Stable for discharge home  Discharge Exam: Vitals:   08/05/20 0752 08/05/20 1205  BP: (!) 165/66 (!) 158/76  Pulse: 60 69  Resp: 18 16  Temp:  98.6 F (37 C)  SpO2: 96% 98%   Vitals:   08/05/20 0012 08/05/20 0446 08/05/20 0752 08/05/20  1205  BP: (!) 137/56 (!) 151/55 (!) 165/66 (!) 158/76  Pulse: 63 (!) 58 60 69  Resp: 17 18 18 16   Temp: 98.3 F (36.8 C) 98.3 F (36.8 C)  98.6 F (37 C)  TempSrc:      SpO2: 97% 94% 96% 98%  Weight:      Height:        General: Pt is alert, awake, not in acute distress Cardiovascular: RRR, S1/S2 +, no rubs, no gallops Respiratory: CTA bilaterally, no wheezing, no rhonchi Abdominal: Soft, NT, ND, bowel sounds + Extremities: no edema, no cyanosis    The results of significant diagnostics from this hospitalization (including imaging, microbiology, ancillary and laboratory) are listed below for reference.     Microbiology: Recent Results (from the past 240 hour(s))  C Difficile Quick Screen w PCR reflex     Status: None   Collection Time: 08/02/20  1:34 AM   Specimen: Stool  Result Value Ref Range Status   C Diff antigen NEGATIVE NEGATIVE Final   C Diff toxin NEGATIVE NEGATIVE Final   C Diff interpretation No C. difficile detected.  Final    Comment: Performed at [facility], [address] [place], [place] [address]  Gastrointestinal Panel by PCR , Stool     Status: Abnormal   Collection Time: 08/02/20  3:03 AM   Specimen: Stool  Result Value Ref Range Status   Campylobacter species NOT DETECTED NOT DETECTED Final   Plesimonas shigelloides NOT DETECTED NOT DETECTED Final   Salmonella species NOT DETECTED NOT DETECTED Final   Yersinia enterocolitica NOT DETECTED NOT DETECTED Final   Vibrio species NOT DETECTED NOT DETECTED Final   Vibrio cholerae NOT DETECTED NOT DETECTED Final   Enteroaggregative E coli (EAEC) NOT DETECTED NOT DETECTED Final   Enteropathogenic E coli (EPEC) NOT DETECTED NOT DETECTED Final   Enterotoxigenic E coli (ETEC) NOT DETECTED NOT DETECTED Final   Shiga like toxin producing E coli (STEC) NOT DETECTED NOT DETECTED Final   Shigella/Enteroinvasive E coli (EIEC) NOT DETECTED NOT DETECTED Final   Cryptosporidium NOT DETECTED NOT DETECTED  Final   Cyclospora cayetanensis NOT DETECTED NOT DETECTED Final   Entamoeba histolytica NOT DETECTED NOT DETECTED Final   Giardia lamblia NOT DETECTED NOT DETECTED Final   Adenovirus F40/41 NOT DETECTED NOT DETECTED Final   Astrovirus NOT DETECTED NOT DETECTED Final   Norovirus GI/GII NOT DETECTED NOT DETECTED Final   Rotavirus A DETECTED (A) NOT DETECTED Final   Sapovirus (I, II, IV, and V) NOT DETECTED NOT DETECTED Final    Comment: Performed at [facility], [address] [place],  [address]  Resp Panel by RT-PCR (Flu A&B, Covid) Nasopharyngeal Swab     Status: None   Collection Time: 08/02/20 11:27 PM   Specimen: Nasopharyngeal Swab; Nasopharyngeal(NP) swabs in vial transport medium  Result Value Ref Range Status   SARS Coronavirus 2 by  RT PCR NEGATIVE NEGATIVE Final    Comment: (NOTE) SARS-CoV-2 target nucleic acids are NOT DETECTED.  The SARS-CoV-2 RNA is generally detectable in upper respiratory specimens during the acute phase of infection. The lowest concentration of SARS-CoV-2 viral copies this assay can detect is 138 copies/mL. A negative result does not preclude SARS-Cov-2 infection and should not be used as the sole basis for treatment or other patient management decisions. A negative result may occur with  improper specimen collection/handling, submission of specimen other than nasopharyngeal swab, presence of viral mutation(s) within the areas targeted by this assay, and inadequate number of viral copies(<138 copies/mL). A negative result must be combined with clinical observations, patient history, and epidemiological information. The expected result is Negative.  Fact Sheet for Patients:  EntrepreneurPulse.com.au  Fact Sheet for Healthcare Providers:  IncredibleEmployment.be  This test is no t yet approved or cleared by the [place] FDA and  has been authorized for detection and/or diagnosis of  SARS-CoV-2 by FDA under an Emergency Use Authorization (EUA). This EUA will remain  in effect (meaning this test can be used) for the duration of the COVID-19 declaration under Section 564(b)(1) of the Act, 21 U.S.C.section 360bbb-3(b)(1), unless the authorization is terminated  or revoked sooner.       Influenza A by PCR NEGATIVE NEGATIVE Final   Influenza B by PCR NEGATIVE NEGATIVE Final    Comment: (NOTE) The Xpert Xpress SARS-CoV-2/FLU/RSV plus assay is intended as an aid in the diagnosis of influenza from Nasopharyngeal swab specimens and should not be used as a sole basis for treatment. Nasal washings and aspirates are unacceptable for Xpert Xpress SARS-CoV-2/FLU/RSV testing.  Fact Sheet for Patients: EntrepreneurPulse.com.au  Fact Sheet for Healthcare Providers: IncredibleEmployment.be  This test is not yet approved or cleared by the [place] FDA and has been authorized for detection and/or diagnosis of SARS-CoV-2 by FDA under an Emergency Use Authorization (EUA). This EUA will remain in effect (meaning this test can be used) for the duration of the COVID-19 declaration under Section 564(b)(1) of the Act, 21 U.S.C. section 360bbb-3(b)(1), unless the authorization is terminated or revoked.  Performed at [facility], [address] [place], [place] [address]      Labs: BNP (last 3 results) No results for input(s): BNP in the last 8760 hours. Basic Metabolic Panel: Recent Labs  Lab 08/02/20 2139 08/03/20 0457 08/04/20 0901  NA 134* 136 140  K 3.6 3.6 4.1  CL 104 109 113*  CO2 18* 20* 21*  GLUCOSE 116* 82 99  BUN 28* 23 9  CREATININE 1.58* 0.93 0.58  CALCIUM 9.0 8.2* 8.2*  MG  --   --  1.9   Liver Function Tests: Recent Labs  Lab 08/02/20 2139  AST 85*  ALT 92*  ALKPHOS 87  BILITOT 0.9  PROT 6.9  ALBUMIN 4.3   Recent Labs  Lab 08/02/20 2139  LIPASE 36   No results for input(s): AMMONIA in  the last 168 hours. CBC: Recent Labs  Lab 08/02/20 2139 08/03/20 0457 08/04/20 0901  WBC 6.7 6.5 4.2  NEUTROABS 3.6  --  1.7  HGB 14.3 12.1 12.5  HCT 43.7 37.8 38.2  MCV 96.3 97.7 96.5  PLT 236 194 179   Cardiac Enzymes: No results for input(s): CKTOTAL, CKMB, CKMBINDEX, TROPONINI in the last 168 hours. BNP: Invalid input(s): POCBNP CBG: Recent Labs  Lab 08/04/20 1153 08/04/20 1646 08/04/20 2100 08/05/20 0749 08/05/20 1202  GLUCAP 95 87 93 115* 112*  D-Dimer No results for input(s): DDIMER in the last 72 hours. Hgb A1c Recent Labs    08/03/20 0457  HGBA1C 6.7*   Lipid Profile No results for input(s): CHOL, HDL, LDLCALC, TRIG, CHOLHDL, LDLDIRECT in the last 72 hours. Thyroid function studies No results for input(s): TSH, T4TOTAL, T3FREE, THYROIDAB in the last 72 hours.  Invalid input(s): FREET3 Anemia work up No results for input(s): VITAMINB12, FOLATE, FERRITIN, TIBC, IRON, RETICCTPCT in the last 72 hours. Urinalysis    Component Value Date/Time   COLORURINE YELLOW (A) 08/03/2020 1142   APPEARANCEUR CLOUDY (A) 08/03/2020 1142   LABSPEC 1.038 (H) 08/03/2020 1142   PHURINE 5.0 08/03/2020 1142   GLUCOSEU NEGATIVE 08/03/2020 1142   HGBUR NEGATIVE 08/03/2020 1142   BILIRUBINUR NEGATIVE 08/03/2020 1142   KETONESUR 20 (A) 08/03/2020 1142   PROTEINUR NEGATIVE 08/03/2020 1142   NITRITE NEGATIVE 08/03/2020 1142   LEUKOCYTESUR TRACE (A) 08/03/2020 1142   Sepsis Labs Invalid input(s): PROCALCITONIN,  WBC,  LACTICIDVEN Microbiology Recent Results (from the past 240 hour(s))  C Difficile Quick Screen w PCR reflex     Status: None   Collection Time: 08/02/20  1:34 AM   Specimen: Stool  Result Value Ref Range Status   C Diff antigen NEGATIVE NEGATIVE Final   C Diff toxin NEGATIVE NEGATIVE Final   C Diff interpretation No C. difficile detected.  Final    Comment: Performed at [facility], [address] [place], [place] [address]  Gastrointestinal  Panel by PCR , Stool     Status: Abnormal   Collection Time: 08/02/20  3:03 AM   Specimen: Stool  Result Value Ref Range Status   Campylobacter species NOT DETECTED NOT DETECTED Final   Plesimonas shigelloides NOT DETECTED NOT DETECTED Final   Salmonella species NOT DETECTED NOT DETECTED Final   Yersinia enterocolitica NOT DETECTED NOT DETECTED Final   Vibrio species NOT DETECTED NOT DETECTED Final   Vibrio cholerae NOT DETECTED NOT DETECTED Final   Enteroaggregative E coli (EAEC) NOT DETECTED NOT DETECTED Final   Enteropathogenic E coli (EPEC) NOT DETECTED NOT DETECTED Final   Enterotoxigenic E coli (ETEC) NOT DETECTED NOT DETECTED Final   Shiga like toxin producing E coli (STEC) NOT DETECTED NOT DETECTED Final   Shigella/Enteroinvasive E coli (EIEC) NOT DETECTED NOT DETECTED Final   Cryptosporidium NOT DETECTED NOT DETECTED Final   Cyclospora cayetanensis NOT DETECTED NOT DETECTED Final   Entamoeba histolytica NOT DETECTED NOT DETECTED Final   Giardia lamblia NOT DETECTED NOT DETECTED Final   Adenovirus F40/41 NOT DETECTED NOT DETECTED Final   Astrovirus NOT DETECTED NOT DETECTED Final   Norovirus GI/GII NOT DETECTED NOT DETECTED Final   Rotavirus A DETECTED (A) NOT DETECTED Final   Sapovirus (I, II, IV, and V) NOT DETECTED NOT DETECTED Final    Comment: Performed at [facility], [address] [place], [place] [address]  Resp Panel by RT-PCR (Flu A&B, Covid) Nasopharyngeal Swab     Status: None   Collection Time: 08/02/20 11:27 PM   Specimen: Nasopharyngeal Swab; Nasopharyngeal(NP) swabs in vial transport medium  Result Value Ref Range Status   SARS Coronavirus 2 by RT PCR NEGATIVE NEGATIVE Final    Comment: (NOTE) SARS-CoV-2 target nucleic acids are NOT DETECTED.  The SARS-CoV-2 RNA is generally detectable in upper respiratory specimens during the acute phase of infection. The lowest concentration of SARS-CoV-2 viral copies this assay can detect is 138 copies/mL. A  negative result does not preclude SARS-Cov-2 infection and should  not be used as the sole basis for treatment or other patient management decisions. A negative result may occur with  improper specimen collection/handling, submission of specimen other than nasopharyngeal swab, presence of viral mutation(s) within the areas targeted by this assay, and inadequate number of viral copies(<138 copies/mL). A negative result must be combined with clinical observations, patient history, and epidemiological information. The expected result is Negative.  Fact Sheet for Patients:  EntrepreneurPulse.com.au  Fact Sheet for Healthcare Providers:  IncredibleEmployment.be  This test is no t yet approved or cleared by the [place] FDA and  has been authorized for detection and/or diagnosis of SARS-CoV-2 by FDA under an Emergency Use Authorization (EUA). This EUA will remain  in effect (meaning this test can be used) for the duration of the COVID-19 declaration under Section 564(b)(1) of the Act, 21 U.S.C.section 360bbb-3(b)(1), unless the authorization is terminated  or revoked sooner.       Influenza A by PCR NEGATIVE NEGATIVE Final   Influenza B by PCR NEGATIVE NEGATIVE Final    Comment: (NOTE) The Xpert Xpress SARS-CoV-2/FLU/RSV plus assay is intended as an aid in the diagnosis of influenza from Nasopharyngeal swab specimens and should not be used as a sole basis for treatment. Nasal washings and aspirates are unacceptable for Xpert Xpress SARS-CoV-2/FLU/RSV testing.  Fact Sheet for Patients: EntrepreneurPulse.com.au  Fact Sheet for Healthcare Providers: IncredibleEmployment.be  This test is not yet approved or cleared by the [place] FDA and has been authorized for detection and/or diagnosis of SARS-CoV-2 by FDA under an Emergency Use Authorization (EUA). This EUA will remain in effect (meaning this test can  be used) for the duration of the COVID-19 declaration under Section 564(b)(1) of the Act, 21 U.S.C. section 360bbb-3(b)(1), unless the authorization is terminated or revoked.  Performed at [facility], [address], [place], [place] [address]      Time coordinating discharge: Over 30 minutes  SIGNED:   Sidney Ace, MD  [facility] 08/05/2020, 12:20 PM Pager   If 7PM-7AM, please contact night-coverage

## 2020-08-05 NOTE — Discharge Instructions (Signed)
Colitis  Colitis is a condition in which the colon is inflamed. It can cause diarrhea, blood in the stool, and abdominal pain. Colitis can last a short time (be acute), or it may last a long time (become chronic). What are the causes? This condition may be caused by:  Infections from viruses or bacteria.  A reaction to medicine.  Certain autoimmune diseases, such as Crohn's disease or ulcerative colitis.  Radiation treatment.  Decreased blood flow to the bowel (ischemia). What are the signs or symptoms? Symptoms of this condition include:  Diarrhea, blood in the stool, or black, tarry stool.  Pain in the joints or abdominal pain.  Fever or fatigue.  Vomiting.  Weight loss.  Bloating.  Having fewer bowel movements than usual.  A strong and sudden urge to have a bowel movement.  Feeling like the bowel is not empty after a bowel movement. How is this diagnosed? This condition may be diagnosed based on a stool test and a blood test. You may also have other tests, such as:  X-rays.  CT scan.  Colonoscopy.  Endoscopy.  Biopsy. How is this treated? Treatment for this condition depends on the cause. This condition may be treated with:  Steps to rest the bowel, such as not eating or drinking for a period of time.  Fluids that are given through an IV.  Medicine for pain and diarrhea.  Antibiotic medicines.  Cortisone medicines.  Surgery. Follow these instructions at home: Eating and drinking  Follow instructions from your health care provider about eating or drinking restrictions.  Drink enough fluid to keep your urine pale yellow.  Work with a dietitian to determine whether certain foods cause your condition to flare up.  Avoid foods or drinks that cause flare-ups.  Eat a well-balanced diet.   General instructions  If you were prescribed an antibiotic medicine, take it as told by your health care provider. Do not stop taking the antibiotic even if  you start to feel better.  Take over-the-counter and prescription medicines only as told by your health care provider.  Keep all follow-up visits. This is important. Contact a health care provider if:  Your symptoms do not go away.  You develop new symptoms. Get help right away if:  You have a fever that does not go away with treatment.  You develop chills.  You have extreme weakness, fainting, or dehydration.  You vomit repeatedly.  You develop severe pain in your abdomen.  You pass bloody or tarry stool. Summary  Colitis is a condition in which the colon is inflamed. Colitis can last a short time (be acute), or it may last a long time (become chronic).  Treatment for this condition depends on the cause and may include resting the bowel, taking medicines, or having surgery.  If you were prescribed an antibiotic medicine, take it as told by your health care provider. Do not stop taking the antibiotic even if you start to feel better.  Get help right away if you develop severe pain in your abdomen.  Keep all follow-up visits. This is important. This information is not intended to replace advice given to you by your health care provider. Make sure you discuss any questions you have with your health care provider. Document Revised: 01/01/2020 Document Reviewed: 01/01/2020 Elsevier Patient Education  2021 [place]. Rotavirus Infection, Adult Rotavirus infection may also be called the stomach flu. This condition is caused by a virus that can be passed from person to person  very easily (is contagious). This condition may affect your stomach, small intestine, and large intestine. It can cause sudden watery diarrhea, fever, and vomiting. Diarrhea and vomiting can make you feel weak and cause you to become dehydrated. You may not be able to keep fluids down. Dehydration can make you tired and thirsty, cause you to have a dry mouth, and decrease how often you urinate. It is  important to replace the fluids that you lose from diarrhea and vomiting. What are the causes? This condition is caused by rotavirus. The virus is present in the stool of an infected person for several days before symptoms start and up to 10 days after symptoms resolve. The most common way this virus is spread is by not washing hands after using the toilet, helping a child use the toilet, or changing a child's diaper. You can be exposed to the virus and get sick by:  Eating food or drinking water that is contaminated with this virus.  Touching a surface that is contaminated with this virus.  Sharing utensils or other personal items with an infected person. What increases the risk? You are more likely to develop this condition if you:  Have a weak body defense system (immune system).  Live with one or more children who are younger than 62 years old.  Live in a nursing home. What are the signs or symptoms? Symptoms of this condition may occur 1-3 days after you become infected. The most common symptoms are watery diarrhea and vomiting. Other symptoms may include:  Fever.  Pain in the abdomen.  Chills.  Weakness.  Nausea.  Loss of appetite. How is this diagnosed? This condition is diagnosed with a medical history and a physical exam. You may also have a stool test to check for the virus. How is this treated? This condition typically goes away on its own within 3 to 7 days. The focus of treatment is to prevent dehydration and restore lost fluids (rehydration). This condition may be treated with:  An oral rehydration solution (ORS) to replace important salts and minerals (electrolytes) in your body. This is a drink that is sold at pharmacies and retail stores.  Medicines to help with your symptoms.  Probiotic supplements to reduce symptoms of diarrhea.  Fluids given through an IV, if dehydration is severe. Older adults and people with other diseases or a weak immune system are at  higher risk for dehydration. Follow these instructions at home: Eating and drinking  Take an ORS as told by your health care provider.  Drink clear fluids in small amounts as you are able. Clear fluids include: ? Water. ? Ice chips. ? Diluted fruit juice. ? Low-calorie sports drinks.  Drink enough fluid to keep your urine pale yellow.  Eat small amounts of healthy foods every 3-4 hours as you are able. This may include whole grains, fruits, vegetables, lean meats, and yogurt.  Avoid fluids that contain a lot of sugar or caffeine, such as energy drinks, sports drinks, and soda.  Avoid spicy or fatty foods.  Avoid alcohol.      General instructions  Wash your hands often. If soap and water are not available, use hand sanitizer.  Make sure that all people in your household wash their hands well and often.  Take over-the-counter and prescription medicines only as told by your health care provider.  Rest at home while you recover.  Watch your condition for any changes.  Take a warm bath to relieve any burning or  pain from frequent diarrhea episodes.  Keep all follow-up visits as told by your health care provider. This is important.   Contact a health care provider if you:  Cannot keep fluids down.  Have symptoms that get worse.  Have new symptoms.  Feel light-headed or dizzy.  Have muscle cramps. Get help right away if you:  Have chest pain.  Feel extremely weak or you faint.  See blood in your vomit.  Have vomit looks like coffee grounds.  Have stools that are bloody or black, or stools that look like tar.  Have a severe headache, a stiff neck, or both.  Have a rash.  Have severe pain, cramping, or bloating in your abdomen.  Have trouble breathing or you are breathing very quickly.  Feel like your heart is beating very quickly.  Have skins that feels cold and clammy.  Feel confused.  Have pain when you urinate.  Have signs of dehydration, such  as: ? Dark urine, very little urine, or no urine. ? Cracked lips. ? Dry mouth. ? Sunken eyes. ? Sleepiness. ? Weakness. Summary  Rotavirus infection may also be called the stomach flu. This condition is caused by a virus. This virus can be passed from person to person very easily (is contagious).  Take an oral rehydration solution (ORS) as told by your health care provider. Drink clear fluids in small amounts as you are able. Clear fluids include water, ice chips, diluted fruit juice, and low-calorie sports drinks.  Wash your hands often. If soap and water are not available, use hand sanitizer.  Contact a health care provider if your symptoms get worse, or you have new symptoms.  Get help right away if you have signs of dehydration. This information is not intended to replace advice given to you by your health care provider. Make sure you discuss any questions you have with your health care provider. Document Revised: 03/01/2018 Document Reviewed: 03/01/2018 Elsevier Patient Education  2021 [employer].

## 2020-08-05 NOTE — Progress Notes (Signed)
Pt d/c to home via POV.  D/c paperwork reviewed with pt and husband, they both expressed understanding.  All belongings taken at time of d/c.  IV removed this AM d/t malplacement - removed without issue.  Pt wheeled down to med mall by nurse and assisted into car.

## 2020-08-05 NOTE — Plan of Care (Signed)
  Problem: Education: Goal: Knowledge of General Education information will improve Description: Including pain rating scale, medication(s)/side effects and non-pharmacologic comfort measures Outcome: Progressing   Problem: Health Behavior/Discharge Planning: Goal: Ability to manage health-related needs will improve Outcome: Progressing   Problem: Clinical Measurements: Goal: Ability to maintain clinical measurements within normal limits will improve Outcome: Progressing   Problem: Clinical Measurements: Goal: Diagnostic test results will improve Outcome: Adequate for Discharge   Problem: Clinical Measurements: Goal: Respiratory complications will improve Outcome: Completed/Met Goal: Cardiovascular complication will be avoided Outcome: Completed/Met   Problem: Activity: Goal: Risk for activity intolerance will decrease Outcome: Completed/Met

## 2020-09-17 ENCOUNTER — Ambulatory Visit: Payer: Medicare Other | Admitting: Dermatology

## 2020-10-10 ENCOUNTER — Other Ambulatory Visit: Payer: Self-pay | Admitting: Urology

## 2020-10-10 ENCOUNTER — Encounter (HOSPITAL_COMMUNITY): Payer: Self-pay | Admitting: Urology

## 2020-10-10 NOTE — Progress Notes (Addendum)
Attempted to do pre op via telephone. Left 2 voicemails spoke with daughter and she told me to call patient.  The following information was obtain via chart review only.  COVID Vaccine Completed: Yes Date COVID Vaccine completed: 07/11/19 Has received booster: COVID vaccine manufacturer: Pfizer      Date of COVID positive in last 90 days:  PCP - Dion Body, MD Cardiologist - Blazing, Venia Carbon, MD last office visit 11/07/19 care everywhere  Chest x-ray - greater than 1 year EKG - 08/02/20 in epic Stress Test -  ECHO -  Cardiac Cath - greater than 2 years Pacemaker/ICD device last checked:  Sleep Study -  CPAP -   Fasting Blood Sugar -  Checks Blood Sugar _____ times a day  Blood Thinner Instructions: Aspirin Instructions: Last Dose:  Activity level:  Unable to go up a flight of stairs without symptoms   Can go up a flight of stairs and activities of daily living without stopping and without symptoms   Able to exercise without symptoms     Anesthesia review: CAD, History of CVA, HTN, DM  Patient denies shortness of breath, fever, cough and chest pain at PAT appointment   Patient verbalized understanding of instructions that were given to them at the PAT appointment. Patient was also instructed that they will need to review over the PAT instructions again at home before surgery.

## 2020-10-14 ENCOUNTER — Ambulatory Visit (HOSPITAL_COMMUNITY): Payer: Medicare Other | Admitting: Physician Assistant

## 2020-10-14 ENCOUNTER — Ambulatory Visit (HOSPITAL_COMMUNITY): Payer: Medicare Other

## 2020-10-14 ENCOUNTER — Ambulatory Visit (HOSPITAL_COMMUNITY)
Admission: RE | Admit: 2020-10-14 | Discharge: 2020-10-14 | Disposition: A | Discharge status: Home / Self Care | Payer: Medicare Other | Attending: Urology | Admitting: Urology

## 2020-10-14 ENCOUNTER — Encounter (HOSPITAL_COMMUNITY)
Admission: RE | Disposition: A | Discharge status: Home / Self Care | Payer: Self-pay | Source: Home / Self Care | Attending: Urology

## 2020-10-14 ENCOUNTER — Encounter (HOSPITAL_COMMUNITY): Payer: Self-pay | Admitting: Urology

## 2020-10-14 DIAGNOSIS — Z8052 Family history of malignant neoplasm of bladder: Secondary | ICD-10-CM | POA: Insufficient documentation

## 2020-10-14 DIAGNOSIS — Z880 Allergy status to penicillin: Secondary | ICD-10-CM | POA: Diagnosis not present

## 2020-10-14 DIAGNOSIS — Z882 Allergy status to sulfonamides status: Secondary | ICD-10-CM | POA: Diagnosis not present

## 2020-10-14 DIAGNOSIS — Z888 Allergy status to other drugs, medicaments and biological substances status: Secondary | ICD-10-CM | POA: Diagnosis not present

## 2020-10-14 DIAGNOSIS — Z808 Family history of malignant neoplasm of other organs or systems: Secondary | ICD-10-CM | POA: Diagnosis not present

## 2020-10-14 DIAGNOSIS — Z955 Presence of coronary angioplasty implant and graft: Secondary | ICD-10-CM | POA: Insufficient documentation

## 2020-10-14 DIAGNOSIS — Z90710 Acquired absence of both cervix and uterus: Secondary | ICD-10-CM | POA: Diagnosis not present

## 2020-10-14 DIAGNOSIS — Z87891 Personal history of nicotine dependence: Secondary | ICD-10-CM | POA: Diagnosis not present

## 2020-10-14 DIAGNOSIS — Z8679 Personal history of other diseases of the circulatory system: Secondary | ICD-10-CM | POA: Diagnosis not present

## 2020-10-14 DIAGNOSIS — C679 Malignant neoplasm of bladder, unspecified: Secondary | ICD-10-CM | POA: Diagnosis present

## 2020-10-14 DIAGNOSIS — Z8719 Personal history of other diseases of the digestive system: Secondary | ICD-10-CM | POA: Insufficient documentation

## 2020-10-14 DIAGNOSIS — C678 Malignant neoplasm of overlapping sites of bladder: Secondary | ICD-10-CM

## 2020-10-14 DIAGNOSIS — Z9049 Acquired absence of other specified parts of digestive tract: Secondary | ICD-10-CM | POA: Insufficient documentation

## 2020-10-14 DIAGNOSIS — Z801 Family history of malignant neoplasm of trachea, bronchus and lung: Secondary | ICD-10-CM | POA: Insufficient documentation

## 2020-10-14 HISTORY — PX: TRANSURETHRAL RESECTION OF BLADDER TUMOR: SHX2575

## 2020-10-14 HISTORY — DX: Fatty (change of) liver, not elsewhere classified: K76.0

## 2020-10-14 LAB — CBC WITH DIFFERENTIAL/PLATELET
Abs Immature Granulocytes: 0.01 10*3/uL (ref 0.00–0.07)
Basophils Absolute: 0 10*3/uL (ref 0.0–0.1)
Basophils Relative: 1 %
Eosinophils Absolute: 0.3 10*3/uL (ref 0.0–0.5)
Eosinophils Relative: 4 %
HCT: 42.9 % (ref 36.0–46.0)
Hemoglobin: 13.7 g/dL (ref 12.0–15.0)
Immature Granulocytes: 0 %
Lymphocytes Relative: 38 %
Lymphs Abs: 2.4 10*3/uL (ref 0.7–4.0)
MCH: 31.6 pg (ref 26.0–34.0)
MCHC: 31.9 g/dL (ref 30.0–36.0)
MCV: 99.1 fL (ref 80.0–100.0)
Monocytes Absolute: 0.7 10*3/uL (ref 0.1–1.0)
Monocytes Relative: 10 %
Neutro Abs: 3 10*3/uL (ref 1.7–7.7)
Neutrophils Relative %: 47 %
Platelets: 208 10*3/uL (ref 150–400)
RBC: 4.33 MIL/uL (ref 3.87–5.11)
RDW: 11.7 % (ref 11.5–15.5)
WBC: 6.3 10*3/uL (ref 4.0–10.5)
nRBC: 0 % (ref 0.0–0.2)

## 2020-10-14 LAB — COMPREHENSIVE METABOLIC PANEL
ALT: 21 U/L (ref 0–44)
AST: 39 U/L (ref 15–41)
Albumin: 4.4 g/dL (ref 3.5–5.0)
Alkaline Phosphatase: 93 U/L (ref 38–126)
Anion gap: 8 (ref 5–15)
BUN: 17 mg/dL (ref 8–23)
CO2: 25 mmol/L (ref 22–32)
Calcium: 9.3 mg/dL (ref 8.9–10.3)
Chloride: 107 mmol/L (ref 98–111)
Creatinine, Ser: 0.68 mg/dL (ref 0.44–1.00)
GFR, Estimated: >60 mL/min (ref >60)
Glucose, Bld: 131 mg/dL — ABNORMAL HIGH (ref 70–99)
Potassium: 5.2 mmol/L — ABNORMAL HIGH (ref 3.5–5.1)
Sodium: 140 mmol/L (ref 135–145)
Total Bilirubin: 1.3 mg/dL — ABNORMAL HIGH (ref 0.3–1.2)
Total Protein: 7.8 g/dL (ref 6.5–8.1)

## 2020-10-14 LAB — GLUCOSE, CAPILLARY: Glucose-Capillary: 125 mg/dL — ABNORMAL HIGH (ref 70–99)

## 2020-10-14 SURGERY — TURBT (TRANSURETHRAL RESECTION OF BLADDER TUMOR)
Anesthesia: General

## 2020-10-14 MED ORDER — EPHEDRINE 5 MG/ML INJ
INTRAVENOUS | Status: AC
  Filled 2020-10-14: qty 10

## 2020-10-14 MED ORDER — ONDANSETRON HCL 4 MG/2ML IJ SOLN
INTRAMUSCULAR | Status: AC
  Filled 2020-10-14: qty 2

## 2020-10-14 MED ORDER — FENTANYL CITRATE (PF) 100 MCG/2ML IJ SOLN
INTRAMUSCULAR | Status: AC
  Filled 2020-10-14: qty 2

## 2020-10-14 MED ORDER — BELLADONNA ALKALOIDS-OPIUM 16.2-30 MG RE SUPP
RECTAL | Status: AC
  Filled 2020-10-14: qty 1

## 2020-10-14 MED ORDER — ONDANSETRON HCL 4 MG/2ML IJ SOLN: 4.0000 mg | Freq: Once | INTRAMUSCULAR | Status: DC | PRN

## 2020-10-14 MED ORDER — ONDANSETRON HCL 4 MG/2ML IJ SOLN
INTRAMUSCULAR | Status: DC | PRN
  Administered 2020-10-14: 4 mg via INTRAVENOUS

## 2020-10-14 MED ORDER — SODIUM CHLORIDE 0.9 % IV SOLN
INTRAVENOUS | Status: DC | PRN
  Administered 2020-10-14: 14 mL

## 2020-10-14 MED ORDER — GEMCITABINE CHEMO FOR BLADDER INSTILLATION 2000 MG
2000.0000 mg | Freq: Once | INTRAVENOUS | Status: DC
Start: 2020-10-14 — End: 2020-10-14
  Filled 2020-10-14: qty 52.6

## 2020-10-14 MED ORDER — DEXAMETHASONE SODIUM PHOSPHATE 10 MG/ML IJ SOLN
INTRAMUSCULAR | Status: DC | PRN
  Administered 2020-10-14: 8 mg via INTRAVENOUS

## 2020-10-14 MED ORDER — LIDOCAINE 2% (20 MG/ML) 5 ML SYRINGE
INTRAMUSCULAR | Status: DC | PRN
  Administered 2020-10-14: 100 mg via INTRAVENOUS

## 2020-10-14 MED ORDER — KETOROLAC TROMETHAMINE 30 MG/ML IJ SOLN: 30.0000 mg | Freq: Once | INTRAMUSCULAR | Status: DC | PRN

## 2020-10-14 MED ORDER — PROPOFOL 10 MG/ML IV BOLUS
INTRAVENOUS | Status: DC | PRN
  Administered 2020-10-14: 200 mg via INTRAVENOUS

## 2020-10-14 MED ORDER — PROPOFOL 10 MG/ML IV BOLUS
INTRAVENOUS | Status: AC
  Filled 2020-10-14: qty 20

## 2020-10-14 MED ORDER — FENTANYL CITRATE (PF) 100 MCG/2ML IJ SOLN
INTRAMUSCULAR | Status: DC | PRN
  Administered 2020-10-14 (×2): 50 ug via INTRAVENOUS

## 2020-10-14 MED ORDER — CEFAZOLIN SODIUM-DEXTROSE 2-4 GM/100ML-% IV SOLN
INTRAVENOUS | Status: AC
  Filled 2020-10-14: qty 100

## 2020-10-14 MED ORDER — PHENYLEPHRINE HCL-NACL 10-0.9 MG/250ML-% IV SOLN
INTRAVENOUS | Status: DC | PRN
  Administered 2020-10-14: 50 ug/min via INTRAVENOUS

## 2020-10-14 MED ORDER — LACTATED RINGERS IV SOLN: INTRAVENOUS | Status: DC

## 2020-10-14 MED ORDER — EPHEDRINE SULFATE-NACL 50-0.9 MG/10ML-% IV SOSY
PREFILLED_SYRINGE | INTRAVENOUS | Status: DC | PRN
  Administered 2020-10-14: 10 mg via INTRAVENOUS

## 2020-10-14 MED ORDER — LIDOCAINE HCL (PF) 2 % IJ SOLN
INTRAMUSCULAR | Status: AC
  Filled 2020-10-14: qty 5

## 2020-10-14 MED ORDER — GLYCOPYRROLATE PF 0.2 MG/ML IJ SOSY
PREFILLED_SYRINGE | INTRAMUSCULAR | Status: AC
  Filled 2020-10-14: qty 1

## 2020-10-14 MED ORDER — FENTANYL CITRATE (PF) 100 MCG/2ML IJ SOLN: 25.0000 ug | INTRAMUSCULAR | Status: DC | PRN

## 2020-10-14 MED ORDER — DEXAMETHASONE SODIUM PHOSPHATE 10 MG/ML IJ SOLN
INTRAMUSCULAR | Status: AC
  Filled 2020-10-14: qty 1

## 2020-10-14 MED ORDER — GLYCOPYRROLATE PF 0.2 MG/ML IJ SOSY
PREFILLED_SYRINGE | INTRAMUSCULAR | Status: DC | PRN
  Administered 2020-10-14: .2 mg via INTRAVENOUS

## 2020-10-14 MED ORDER — CEFAZOLIN SODIUM-DEXTROSE 2-4 GM/100ML-% IV SOLN
2.0000 g | Freq: Once | INTRAVENOUS | Status: AC
  Administered 2020-10-14: 2 g via INTRAVENOUS
  Filled 2020-10-14: qty 100

## 2020-10-14 SURGICAL SUPPLY — 17 items
BAG URINE DRAIN 2000ML AR STRL (UROLOGICAL SUPPLIES) ×3 IMPLANT
BAG URO CATCHER STRL LF (MISCELLANEOUS) ×3 IMPLANT
CATH FOLEY 2WAY SLVR  5CC 16FR (CATHETERS) ×2
CATH FOLEY 2WAY SLVR 5CC 16FR (CATHETERS) ×1 IMPLANT
CATH URET 5FR 28IN OPEN ENDED (CATHETERS) ×3 IMPLANT
DRAPE FOOT SWITCH (DRAPES) ×3 IMPLANT
GLOVE SURG ENC TEXT LTX SZ7.5 (GLOVE) ×3 IMPLANT
GOWN STRL REUS W/TWL XL LVL3 (GOWN DISPOSABLE) ×3 IMPLANT
KIT TURNOVER KIT A (KITS) ×3 IMPLANT
LOOP CUT BIPOLAR 24F LRG (ELECTROSURGICAL) IMPLANT
MANIFOLD NEPTUNE II (INSTRUMENTS) ×3 IMPLANT
PACK CYSTO (CUSTOM PROCEDURE TRAY) ×3 IMPLANT
PENCIL SMOKE EVACUATOR (MISCELLANEOUS) IMPLANT
PLUG CATH AND CAP STER (CATHETERS) ×3 IMPLANT
TUBING CONNECTING 10 (TUBING) ×2 IMPLANT
TUBING CONNECTING 10' (TUBING) ×1
TUBING UROLOGY SET (TUBING) ×3 IMPLANT

## 2020-10-14 NOTE — Anesthesia Procedure Notes (Signed)
Procedure Name: LMA Insertion Date/Time: 10/14/2020 12:37 PM Performed by: Mitzie Na, CRNA Pre-anesthesia Checklist: Patient identified, Emergency Drugs available, Suction available and Patient being monitored Patient Re-evaluated:Patient Re-evaluated prior to induction Oxygen Delivery Method: Circle system utilized Preoxygenation: Pre-oxygenation with 100% oxygen Induction Type: IV induction Ventilation: Mask ventilation without difficulty LMA: LMA inserted LMA Size: 4.0 Number of attempts: 1 Placement Confirmation: positive ETCO2 and breath sounds checked- equal and bilateral Tube secured with: Tape Dental Injury: Teeth and Oropharynx as per pre-operative assessment

## 2020-10-14 NOTE — Interval H&P Note (Signed)
History and Physical Interval Note:  10/14/2020 12:00 PM  Meagan Osborne  has presented today for surgery, with the diagnosis of BLADDER NEOPLASM.  The various methods of treatment have been discussed with the patient and family. After consideration of risks, benefits and other options for treatment, the patient has consented to  Procedure(s) with comments: TRANSURETHRAL RESECTION OF BLADDER TUMOR (TURBT)/ CYSTOSCOPY/ POST OPERATIVE INSTILLATION OF GEMCITABINE/ BILATERAL RETROGRADE (N/A) - ONLY NEEDS 60 MIN as a surgical intervention.  The patient's history has been reviewed, patient examined, no change in status, stable for surgery.  I have reviewed the patient's chart and labs.  Questions were answered to the patient's satisfaction.  She is well. No dysuria or gross hematuria. No fever or congestion.    Festus Aloe

## 2020-10-14 NOTE — Anesthesia Preprocedure Evaluation (Addendum)
Anesthesia Evaluation  Patient identified by MRN, date of birth, ID band Patient awake    Reviewed: Allergy & Precautions, NPO status , Patient's Chart, lab work & pertinent test results  History of Anesthesia Complications (+) PONV and DIFFICULT AIRWAY  Airway Mallampati: III  TM Distance: <3 FB Neck ROM: Limited    Dental no notable dental hx.    Pulmonary neg pulmonary ROS, former smoker,    Pulmonary exam normal breath sounds clear to auscultation       Cardiovascular hypertension, Pt. on medications + CAD and + Cardiac Stents  Normal cardiovascular exam Rhythm:Regular Rate:Normal     Neuro/Psych negative neurological ROS  negative psych ROS   GI/Hepatic Neg liver ROS, GERD  ,  Endo/Other  negative endocrine ROSdiabetes  Renal/GU negative Renal ROS  negative genitourinary   Musculoskeletal negative musculoskeletal ROS (+)   Abdominal   Peds negative pediatric ROS (+)  Hematology negative hematology ROS (+)   Anesthesia Other Findings   Reproductive/Obstetrics negative OB ROS                            Anesthesia Physical Anesthesia Plan  ASA: III  Anesthesia Plan: General   Post-op Pain Management:    Induction: Intravenous  PONV Risk Score and Plan: 4 or greater and Ondansetron, Dexamethasone and Treatment may vary due to age or medical condition  Airway Management Planned: LMA  Additional Equipment:   Intra-op Plan:   Post-operative Plan: Extubation in OR  Informed Consent: I have reviewed the patients History and Physical, chart, labs and discussed the procedure including the risks, benefits and alternatives for the proposed anesthesia with the patient or authorized representative who has indicated his/her understanding and acceptance.     Dental advisory given  Plan Discussed with: CRNA and Surgeon  Anesthesia Plan Comments:         Anesthesia Quick  Evaluation

## 2020-10-14 NOTE — H&P (Signed)
Office Visit Report     10/01/2020   --------------------------------------------------------------------------------   Meagan Osborne  MRN: [id number]  DOB: [birth year], 75 year old Female  SSN: [id number]   PRIMARY CARE:  Dion Body, MD  REFERRING:  Georgette Dover, MD  PROVIDER:  Festus Aloe, M.D.  LOCATION:  [facility] [phone]     --------------------------------------------------------------------------------   CC/HPI: F/u -   1) H/o bladder cancer -   -Sept 2015 - high-grade TA (majority low-grade, focal high-grade) left bladder s/p TURBT with [facility] Nl EUA. Presented with microscopic hematuria and irritative symptoms.  - Jan 2017 - small LG Ta right anterior   Last upper tract: Jun 2021 renal US, Dec 2016 CT hematuria; Sept 2015 CT hematuria  Last cytology: Mar 2016 - negative   She can't take estrogens because of family history. No hematuria or LUTS.     ALLERGIES: Amoxicillin TABS Betadine Biaxin TABS losartan Shellfish-derived Products  Sulfa Drugs    MEDICATIONS: Amlodipine Besylate  Aspirin Ec 81 mg tablet, delayed release Oral  Atorvastatin Calcium 40 mg tablet Oral  Benazepril Hcl 40 mg tablet Oral  Carvedilol 25 mg tablet Oral  Flonase 50 mcg/actuation spray, suspension Nasal  Lexapro  Multivitamins tablet Oral  Nexium 40 mg capsule,delayed release Oral     GU PSH: Bladder Instill AntiCA Agent - 2015 Cysto Fulgurate < 0.5 cm - 2017 Cystoscopy - 10/01/2019, 2020, 2019, 2018, 2018, 2017 Cystoscopy TURBT 2-5 cm - 2015 Hysterectomy Unilat SO - 2015       PSH Notes: Cystoscopy With Fulguration Minor Lesion (Under 70mm), Rotator Cuff Repair, Bladder Injection Of Cancer Treatment, Cystoscopy With Fulguration Medium Lesion (2-5cm), Cath Stent Placement, Cholecystectomy, Hysterectomy, Heart Surgery, Back Surgery   NON-GU PSH: Cholecystectomy (open) - 2015     GU PMH: Gross hematuria, set up renal US -  10/01/2019 Dysuria - 2019 History of bladder cancer - 2019 Bladder Cancer Lateral - 2018, - 2018, - 2017, Malignant neoplasm of lateral wall of urinary bladder, - 2017 Urinary Urgency, Urinary urgency - 2017 Urinary Frequency, Increased urinary frequency - 2016 Bladder, Neoplasm of Unspecified behavior, Bladder neoplasm - 2015 Other microscopic hematuria, Microhematuria - 2015      PMH Notes: compression fracture-thoracic-March 2021   NON-GU PMH: Encounter for general adult medical examination without abnormal findings, Encounter for preventive health examination - 2015 Anxiety, Anxiety - 2015 Personal history of other diseases of the circulatory system, History of cardiac disorder - 2015, History of hypertension, - 2015 Personal history of other diseases of the digestive system, History of esophageal reflux - 2015 Personal history of other diseases of the musculoskeletal system and connective tissue, History of arthritis - 2015 Personal history of other endocrine, nutritional and metabolic disease, History of hypercholesterolemia - 2015    FAMILY HISTORY: Dementia - Runs In Family lung disease - Runs In Family malignant neoplasm of urinary bladder - Runs In Family throat cancer - Runs In Family   SOCIAL HISTORY: Marital Status: Married Preferred Language: English; Race: White Current Smoking Status: Patient does not smoke anymore.  Drinks 2 caffeinated drinks per day.     Notes: Former smoker, Married, Retired, Three children, No alcohol use, Daily caffeine consumption, 2-3 servings a day   REVIEW OF SYSTEMS:    GU Review Female:   Patient denies frequent urination, hard to postpone urination, burning /pain with urination, get up at night to urinate, leakage of urine, stream starts and stops, trouble starting your stream, have to  strain to urinate, and being pregnant.  Gastrointestinal (Upper):   Patient denies nausea, vomiting, and indigestion/ heartburn.  Gastrointestinal  (Lower):   Patient denies diarrhea and constipation.  Constitutional:   Patient denies fever, night sweats, weight loss, and fatigue.  Skin:   Patient denies skin rash/ lesion and itching.  Eyes:   Patient denies blurred vision and double vision.  Ears/ Nose/ Throat:   Patient denies sore throat and sinus problems.  Hematologic/Lymphatic:   Patient denies swollen glands and easy bruising.  Cardiovascular:   Patient denies leg swelling and chest pains.  Respiratory:   Patient denies cough and shortness of breath.  Endocrine:   Patient denies excessive thirst.  Musculoskeletal:   Patient denies back pain and joint pain.  Neurological:   Patient denies headaches and dizziness.  Psychologic:   Patient denies anxiety and depression.   VITAL SIGNS: None   GU PHYSICAL EXAMINATION:    Urethral Meatus: Normal size. Normal position. No discharge.  Vagina: No atrophy, no stenosis. No rectocele. No cystocele. No enterocele.   MULTI-SYSTEM PHYSICAL EXAMINATION:    Constitutional: Well-nourished. No physical deformities. Normally developed. Good grooming.  Neck: Neck symmetrical, not swollen. Normal tracheal position.  Respiratory: No labored breathing, no use of accessory muscles.   Cardiovascular: Normal temperature, normal extremity pulses, no swelling, no varicosities.  Neurologic / Psychiatric: Oriented to time, oriented to place, oriented to person. No depression, no anxiety, no agitation.  Gastrointestinal: No mass, no tenderness, no rigidity, non obese abdomen.     PAST DATA REVIEW: None   PROCEDURES:         Flexible Cystoscopy - 52000  Risks, benefits, and some of the potential complications of the procedure were discussed at length with the patient including infection, bleeding, voiding discomfort, urinary retention, fever, chills, sepsis, and others. All questions were answered. Informed consent was obtained. Antibiotic prophylaxis was given. Sterile technique and intraurethral  analgesia were used. Chaperone - Lauren - for exam and cystoscopy.   Meatus:  Normal size. Normal location. Normal condition.  Urethra:  No hypermobility. No leakage.  Ureteral Orifices:  Normal location. Normal size. Normal shape. Effluxed clear urine.  Bladder:  No trabeculation. Normal mucosa. No stones. A small papillary tumor right BN 11 o'clock and a small tumor right dome.       The lower urinary tract was carefully examined. The procedure was well-tolerated and without complications. Antibiotic instructions were given. Instructions were given to call the office immediately for bloody urine, difficulty urinating, urinary retention, painful or frequent urination, fever, chills, nausea, vomiting or other illness. The patient stated that she understood these instructions and would comply with them.         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 All questions answered and she elects to proceed.    PLAN:           Schedule Return Visit/Planned Activity: Next Available Appointment - Schedule Surgery          Document Letter(s):  Created for Patient: Clinical Summary    *  Signed by Festus Aloe, M.D. on 10/02/20 at 12:17 PM (EDT)*     The information contained in this medical record document is considered private and confidential patient information. This information can only be used for the medical diagnosis and/or medical services that are being provided by the patient's selected caregivers. This information can only be distributed outside of the  patient's care if the patient agrees and signs waivers of authorization for this information to be sent to an outside source or route.

## 2020-10-14 NOTE — Anesthesia Postprocedure Evaluation (Signed)
Anesthesia Post Note  Patient: CHEYENNE BORDEAUX  Procedure(s) Performed: TRANSURETHRAL RESECTION OF BLADDER TUMOR (TURBT)/ CYSTOSCOPY/ POST OPERATIVE INSTILLATION OF GEMCITABINE/ BILATERAL RETROGRADE (N/A )     Patient location during evaluation: PACU Anesthesia Type: General Level of consciousness: awake and alert Pain management: pain level controlled Vital Signs Assessment: post-procedure vital signs reviewed and stable Respiratory status: spontaneous breathing, nonlabored ventilation, respiratory function stable and patient connected to nasal cannula oxygen Cardiovascular status: blood pressure returned to baseline and stable Postop Assessment: no apparent nausea or vomiting Anesthetic complications: no   No complications documented.  Last Vitals:  Vitals:   10/14/20 1330 10/14/20 1345  BP: (!) 150/82 136/79  Pulse: 81 80  Resp: 17 14  Temp:    SpO2: 100% 100%    Last Pain:  Vitals:   10/14/20 1345  TempSrc:   PainSc: 0-No pain                 Emit Kuenzel S

## 2020-10-14 NOTE — Transfer of Care (Signed)
Immediate Anesthesia Transfer of Care Note  Patient: Meagan Osborne  Procedure(s) Performed: TRANSURETHRAL RESECTION OF BLADDER TUMOR (TURBT)/ CYSTOSCOPY/ POST OPERATIVE INSTILLATION OF GEMCITABINE/ BILATERAL RETROGRADE (N/A )  Patient Location: PACU  Anesthesia Type:General  Level of Consciousness: awake, alert , oriented and patient cooperative  Airway & Oxygen Therapy: Patient Spontanous Breathing and Patient connected to face mask oxygen  Post-op Assessment: Report given to RN, Post -op Vital signs reviewed and stable and Patient moving all extremities  Post vital signs: Reviewed and stable  Last Vitals:  Vitals Value Taken Time  BP 166/81 10/14/20 1324  Temp    Pulse 84 10/14/20 1327  Resp 17 10/14/20 1327  SpO2 99 % 10/14/20 1327  Vitals shown include unvalidated device data.  Last Pain:  Vitals:   10/14/20 1033  TempSrc: Oral         Complications: No complications documented.

## 2020-10-14 NOTE — Op Note (Signed)
Preoperative diagnosis: Bladder cancer Postoperative diagnosis: Bladder cancer  Procedure: TURBT 0.5 to 2 cm, bilateral retrograde pyelogram, postoperative instillation of gemcitabine in PACU  Surgeon: Junious Silk  Anesthesia: General  Indication for procedure: Lateya is a 75 year old female with a history of high-grade and low-grade TA bladder cancer in 2015 and 2017 respectively.  She has done well without recurrences but 2 small recurrences were noted on office cystoscopy recently.  Findings: On exam under anesthesia there was a grade 2 rectocele, no vulvar lesions.  Mild atrophy.  Small urethral carbuncle.  On cystoscopy the urethra was unremarkable, trigone and ureteral orifice ease appeared normal.  There were 2 small papillary lesions 1 at the right bladder neck which was about a centimeter and 1 posterior superior that was about 8 mm.  Both appeared superficial and were removed with the loop.  Left retrograde pyelogram-this outlined a single ureter single collecting system unit without filling defect, stricture or dilation.  Right retrograde pyelogram-this outlined a single ureter single collecting system unit without filling defect, stricture or dilation.  Description of procedure: After consent was obtained patient brought to the operating room.  After adequate anesthesia she was placed in lithotomy position and prepped and draped in the usual sterile fashion.  A timeout was performed to confirm the patient and procedure.  The cystoscope was passed per urethra and the bladder carefully inspected with a 30 degree and 70 degree lens.  Given the right bladder neck tumor it made more sense to use the loop given its size and location.  The continuous-flow sheath was passed with the obturator and then the handle and loop were passed.  The posterior superior bladder tumor was taken off with 1 swipe of the loop.  This was sent to pathology.  Hemostasis obtained.  The right bladder neck tumor which was  up at about 10 11:00 on the right side was resected.  This was sent separately.  Fulguration of the tumor base was performed.  Hemostasis was excellent at low pressure.  The scope was removed and a 88 French Foley placed in left to gravity drainage and drainage was clear.  She was awakened taken to recovery room in stable condition.  Instillation of gemcitabine in PACU: 2000 mg of gemcitabine was instilled per urethra and left indwelling in the bladder for 50 minutes and then drained.  Complications: None  Blood loss: Minimal  Specimens to pathology: #1 posterior bladder tumor #2 right bladder neck tumor  Drains: 16 French Foley catheter  Disposition: Patient stable to PACU

## 2020-10-14 NOTE — Discharge Instructions (Signed)
Bladder Biopsy, Care After This sheet gives you information about how to care for yourself after your procedure. Your health care provider may also give you more specific instructions. If you have problems or questions, contact your health care provider. What can I expect after the procedure? After the procedure, it is common to have:  Mild pain in your bladder or kidney area during urination.  Minor burning during urination.  Small amounts of blood in your urine.  A sudden urge to urinate.  A need to urinate more often than usual. Follow these instructions at home: Medicines  Take over-the-counter and prescription medicines only as told by your health care provider.  If you were prescribed an antibiotic medicine, take it as told by your health care provider. Do not stop taking the antibiotic even if you start to feel better. Activity  Rest if told by your health care provider.  Do not drive for 24 hours if you received a medicine to help you relax (sedative) during your procedure. Ask your health care provider when it is safe for you to drive.  Return to your normal activities as told by your health care provider. Ask your health care provider what activities are safe for you. General instructions  Take a warm bath to relieve any burning sensations around your urethra.  Hold a warm, damp washcloth over the urethral area to ease pain.  It is up to you to get the results of your procedure. Ask your health care provider, or the department that is doing the procedure, when your results will be ready.  Keep all follow-up visits as told by your health care provider. This is important.   Contact a health care provider if:  You have a fever.  Your symptoms do not improve within 24 hours, and you continue to have: ? Burning during urination. ? Increasing amounts of blood in your urine. ? Pain during urination. ? An urgent need to urinate. ? A need to urinate more often than  usual. Get help right away if:  You have a lot of bleeding or more bleeding.  You have severe pain.  You are unable to urinate.  You have bright red blood in your urine.  You are passing blood clots in your urine.  You have a fever. Summary  After the procedure, it is common to have mild pain, burning with urination, and some blood.  Take medicines as told. If you were given antibiotics, finish all of it even if you start to feel better.  Rest after the procedure. Follow your health care provider's instructions for self care at home.  Contact a health care provider if your symptoms do not improve within 24 hours, or if you have more pain or more blood in your urine.  Get help right away if you have a lot of bleeding, severe pain, fever, or bright red blood or blood clots in the urine. This information is not intended to replace advice given to you by your health care provider. Make sure you discuss any questions you have with your health care provider. Document Revised: 11/01/2018 Document Reviewed: 11/01/2018 Elsevier Patient Education  [place].

## 2020-10-15 ENCOUNTER — Encounter (HOSPITAL_COMMUNITY): Payer: Self-pay | Admitting: Urology

## 2020-10-16 LAB — SURGICAL PATHOLOGY

## 2020-12-15 ENCOUNTER — Ambulatory Visit: Payer: Medicare Other | Admitting: Dermatology

## 2021-01-06 ENCOUNTER — Other Ambulatory Visit: Payer: Self-pay

## 2021-01-06 ENCOUNTER — Ambulatory Visit (INDEPENDENT_AMBULATORY_CARE_PROVIDER_SITE_OTHER): Payer: Medicare Other | Admitting: Dermatology

## 2021-01-06 DIAGNOSIS — L409 Psoriasis, unspecified: Secondary | ICD-10-CM

## 2021-01-06 MED ORDER — FLUOCINONIDE 0.05 % EX SOLN
CUTANEOUS | 11 refills | Status: DC
Start: 2021-01-06 — End: 2022-01-07

## 2021-01-06 NOTE — Progress Notes (Signed)
   Follow-Up Visit   Subjective  Meagan Osborne is a 75 y.o. female who presents for the following: Psoriasis (Patient states that psoriasis is well controlled with Lidex solution QD up to 5d/wk. She would like refills sent in today. ).  The following portions of the chart were reviewed this encounter and updated as appropriate:   Tobacco  Allergies  Meds  Problems  Med Hx  Surg Hx  Fam Hx     Review of Systems:  No other skin or systemic complaints except as noted in HPI or Assessment and Plan.  Objective  Well appearing patient in no apparent distress; mood and affect are within normal limits.  A focused examination was performed including the scalp. Relevant physical exam findings are noted in the Assessment and Plan.  Scalp Clear.   Assessment & Plan  Psoriasis Scalp Persistent but better controlled on current treatment.  Psoriasis is a chronic non-curable, but treatable genetic/hereditary disease that may have other systemic features affecting other organ systems such as joints (Psoriatic Arthritis). It is associated with an increased risk of inflammatory bowel disease, heart disease, non-alcoholic fatty liver disease, and depression.    Continue Fluocinonide sol to aa's QD up to 5d/wk. Topical steroids (such as triamcinolone, fluocinolone, fluocinonide, mometasone, clobetasol, halobetasol, betamethasone, hydrocortisone) can cause thinning and lightening of the skin if they are used for too long in the same area. Your physician has selected the right strength medicine for your problem and area affected on the body. Please use your medication only as directed by your physician to prevent side effects.    Related Medications fluocinonide (LIDEX) 0.05 % external solution Apply to aa's scalp QHS up to 5 days per week. Avoid face, groin, and axilla.  Return in about 1 year (around 01/06/2022) for psoriasis follow up .  Luther Redo, CMA, am acting as scribe for Sarina Ser, MD . Documentation: I have reviewed the above documentation for accuracy and completeness, and I agree with the above.  Sarina Ser, MD

## 2021-01-06 NOTE — Patient Instructions (Signed)

## 2021-01-07 ENCOUNTER — Encounter: Payer: Self-pay | Admitting: Dermatology

## 2021-02-09 ENCOUNTER — Telehealth: Payer: Self-pay

## 2021-04-09 DIAGNOSIS — Z8616 Personal history of COVID-19: Secondary | ICD-10-CM

## 2021-04-09 HISTORY — DX: Personal history of COVID-19: Z86.16

## 2021-06-22 ENCOUNTER — Other Ambulatory Visit: Payer: Self-pay | Admitting: Urology

## 2021-07-29 ENCOUNTER — Other Ambulatory Visit: Payer: Self-pay

## 2021-07-29 ENCOUNTER — Encounter (HOSPITAL_BASED_OUTPATIENT_CLINIC_OR_DEPARTMENT_OTHER): Payer: Self-pay | Admitting: Urology

## 2021-07-29 NOTE — Progress Notes (Addendum)
ADDENDUM:  chart reviewed w/ anesthesia, Dr Elgie Congo MDA, stated ok to proceed. ? ? ?Spoke w/ via phone for pre-op interview--- pt ?Lab needs dos----  istat            ?Lab results------ current ekg in epic/ chart ?COVID test -----patient states asymptomatic no test needed ?Arrive at ------- 0530 on 07-31-2021 ?NPO after MN NO Solid Food.  Clear liquids from MN until--- 0430 ?Med rec completed ?Medications to take morning of surgery ----- coreg, nexium ?Diabetic medication ----- ?Patient instructed no nail polish to be worn day of surgery ?Patient instructed to bring photo id and insurance card day of surgery ?Patient aware to have Driver (ride ) / caregiver  for 24 hours after surgery --husband, gene ?Patient Special Instructions ----- n/a ?Pre-Op special Istructions ----- n/a ?Patient verbalized understanding of instructions that were given at this phone interview. ?Patient denies shortness of breath, chest pain, fever, cough at this phone interview.  ? ? ?Anesthesia Review:  HTN;  CAD  s/p PCI with DES to mLAD in 2008 and 2009;  s/p right CEA 2011;  Diet controlled DM2; Fatty liver (pt has normal cmp 06-17-2021 in epic);  note in epic hx pt difficult airway, pt told crooked airway in 2011, but pt did fine glidescope '@[facility]'$  06-07-2011 and last surgery '@[facility]'$  10-14-2020. ? ?Pt denies cardiac s&s, sob, and no peripheral swelling.  Stated last taken a nitro >2 yrs ago. ? ?PCP: Dr Raliegh Ip. [place] ?Cardiologist : Dr Jerilynn Mages. Sketch (lov 12-22-2020 epic) ?Chest x-ray :  ?EKG : 08-02-2020 epic ?Echo :  07-30-2014  care everywhere ?Stress test: nuclear 01-08-2021 care everywhere ?Cardiac Cath :  07-30-2014 care everywhere ?Activity level: Meagan Osborne sob w/ any activity ?Sleep Study/ CPAP : ?Fasting Blood Sugar :      / Checks Blood Sugar -- times a day:  does not check ?Blood Thinner/ Instructions /Last Dose: NO ?ASA / Instructions/ Last Dose :  ASA '81mg'$ /  pt stated was given instructions from Dr Junious Silk office to stop week prior to  surgery, last dose 07-25-2021 ?

## 2021-07-30 NOTE — Anesthesia Preprocedure Evaluation (Addendum)
Anesthesia Evaluation  ?Patient identified by MRN, date of birth, ID band ? ?Reviewed: ?Allergy & Precautions, NPO status , Patient's Chart, lab work & pertinent test results ? ?History of Anesthesia Complications ?(+) PONV, DIFFICULT AIRWAY and history of anesthetic complications ? ?Airway ?Mallampati: III ? ?TM Distance: >3 FB ?Neck ROM: Full ? ? ? Dental ?no notable dental hx. ?(+) Teeth Intact, Dental Advisory Given ?  ?Pulmonary ?former smoker,  ?  ?Pulmonary exam normal ?breath sounds clear to auscultation ? ? ? ? ? ? Cardiovascular ?hypertension, + CAD and + Cardiac Stents  ?Normal cardiovascular exam ?Rhythm:Regular Rate:Normal ? ? ?  ?Neuro/Psych ?Anxiety CVA   ? GI/Hepatic ?Neg liver ROS, hiatal hernia, GERD  ,  ?Endo/Other  ?diabetes ? Renal/GU ?Lab Results ?     Component                Value               Date                 ?     CREATININE               0.70                07/31/2021           ?     K                        4.4                 07/31/2021           ?      ?Female GU complaint (hx of bladder CA) ? ? ?  ?Musculoskeletal ? ?(+) Arthritis ,  ? Abdominal ?  ?Peds ? Hematology ?Lab Results ?     Component                Value               Date                 ?     WBC                      6.3                 10/14/2020           ?     HGB                      13.9                07/31/2021           ?     HCT                      41.0                07/31/2021           ?     MCV                      99.1                10/14/2020           ?     PLT  208                 10/14/2020           ?   ?Anesthesia Other Findings ?ALL: See list ? Reproductive/Obstetrics ? ?  ? ? ? ? ? ? ? ? ? ? ? ? ? ?  ?  ? ? ? ? ? ? ? ?Anesthesia Physical ?Anesthesia Plan ? ?ASA: 3 ? ?Anesthesia Plan: General  ? ?Post-op Pain Management:   ? ?Induction: Intravenous ? ?PONV Risk Score and Plan: 4 or greater and Treatment may vary due to age or medical  condition, Ondansetron, Dexamethasone and TIVA ? ?Airway Management Planned: LMA ? ?Additional Equipment: None ? ?Intra-op Plan:  ? ?Post-operative Plan:  ? ?Informed Consent: I have reviewed the patients History and Physical, chart, labs and discussed the procedure including the risks, benefits and alternatives for the proposed anesthesia with the patient or authorized representative who has indicated his/her understanding and acceptance.  ? ? ? ?Dental advisory given ? ?Plan Discussed with: CRNA and Anesthesiologist ? ?Anesthesia Plan Comments:   ? ? ? ? ? ?Anesthesia Quick Evaluation ? ?

## 2021-07-31 ENCOUNTER — Encounter (HOSPITAL_BASED_OUTPATIENT_CLINIC_OR_DEPARTMENT_OTHER)
Admission: RE | Disposition: A | Discharge status: Home / Self Care | Payer: Self-pay | Source: Ambulatory Visit | Attending: Urology

## 2021-07-31 ENCOUNTER — Ambulatory Visit (HOSPITAL_BASED_OUTPATIENT_CLINIC_OR_DEPARTMENT_OTHER): Payer: Medicare Other | Admitting: Anesthesiology

## 2021-07-31 ENCOUNTER — Encounter (HOSPITAL_BASED_OUTPATIENT_CLINIC_OR_DEPARTMENT_OTHER): Payer: Self-pay | Admitting: Urology

## 2021-07-31 ENCOUNTER — Ambulatory Visit (HOSPITAL_BASED_OUTPATIENT_CLINIC_OR_DEPARTMENT_OTHER)
Admission: RE | Admit: 2021-07-31 | Discharge: 2021-07-31 | Disposition: A | Discharge status: Home / Self Care | Payer: Medicare Other | Source: Ambulatory Visit | Attending: Urology | Admitting: Urology

## 2021-07-31 DIAGNOSIS — C679 Malignant neoplasm of bladder, unspecified: Secondary | ICD-10-CM | POA: Diagnosis present

## 2021-07-31 DIAGNOSIS — C674 Malignant neoplasm of posterior wall of bladder: Secondary | ICD-10-CM | POA: Diagnosis not present

## 2021-07-31 DIAGNOSIS — K219 Gastro-esophageal reflux disease without esophagitis: Secondary | ICD-10-CM | POA: Insufficient documentation

## 2021-07-31 DIAGNOSIS — Z955 Presence of coronary angioplasty implant and graft: Secondary | ICD-10-CM | POA: Diagnosis not present

## 2021-07-31 DIAGNOSIS — Z79899 Other long term (current) drug therapy: Secondary | ICD-10-CM | POA: Insufficient documentation

## 2021-07-31 DIAGNOSIS — E119 Type 2 diabetes mellitus without complications: Secondary | ICD-10-CM | POA: Diagnosis not present

## 2021-07-31 DIAGNOSIS — F419 Anxiety disorder, unspecified: Secondary | ICD-10-CM | POA: Insufficient documentation

## 2021-07-31 DIAGNOSIS — M1990 Unspecified osteoarthritis, unspecified site: Secondary | ICD-10-CM | POA: Insufficient documentation

## 2021-07-31 DIAGNOSIS — I2510 Atherosclerotic heart disease of native coronary artery without angina pectoris: Secondary | ICD-10-CM | POA: Insufficient documentation

## 2021-07-31 DIAGNOSIS — Z87891 Personal history of nicotine dependence: Secondary | ICD-10-CM | POA: Insufficient documentation

## 2021-07-31 DIAGNOSIS — I10 Essential (primary) hypertension: Secondary | ICD-10-CM | POA: Insufficient documentation

## 2021-07-31 DIAGNOSIS — Z8616 Personal history of COVID-19: Secondary | ICD-10-CM | POA: Diagnosis not present

## 2021-07-31 HISTORY — DX: Type 2 diabetes mellitus without complications: E11.9

## 2021-07-31 HISTORY — DX: Other complications of anesthesia, initial encounter: T88.59XA

## 2021-07-31 HISTORY — DX: Full incontinence of feces: R15.9

## 2021-07-31 HISTORY — DX: Mixed hyperlipidemia: E78.2

## 2021-07-31 HISTORY — PX: CYSTOSCOPY WITH FULGERATION: SHX6638

## 2021-07-31 HISTORY — DX: Malignant neoplasm of bladder, unspecified: C67.9

## 2021-07-31 HISTORY — DX: Unspecified symptoms and signs involving the genitourinary system: R39.9

## 2021-07-31 HISTORY — DX: Other chronic pain: G89.29

## 2021-07-31 HISTORY — DX: Failed or difficult intubation, initial encounter: T88.4XXA

## 2021-07-31 HISTORY — DX: Unspecified urinary incontinence: R32

## 2021-07-31 HISTORY — DX: Low back pain, unspecified: M54.50

## 2021-07-31 HISTORY — DX: Peripheral vascular disease, unspecified: I73.9

## 2021-07-31 HISTORY — DX: Diaphragmatic hernia without obstruction or gangrene: K44.9

## 2021-07-31 HISTORY — DX: Barrett's esophagus without dysplasia: K22.70

## 2021-07-31 LAB — POCT I-STAT, CHEM 8
BUN: 17 mg/dL (ref 8–23)
Calcium, Ion: 1.26 mmol/L (ref 1.15–1.40)
Chloride: 104 mmol/L (ref 98–111)
Creatinine, Ser: 0.7 mg/dL (ref 0.44–1.00)
Glucose, Bld: 147 mg/dL — ABNORMAL HIGH (ref 70–99)
HCT: 41 % (ref 36.0–46.0)
Hemoglobin: 13.9 g/dL (ref 12.0–15.0)
Potassium: 4.4 mmol/L (ref 3.5–5.1)
Sodium: 141 mmol/L (ref 135–145)
TCO2: 27 mmol/L (ref 22–32)

## 2021-07-31 LAB — GLUCOSE, CAPILLARY: Glucose-Capillary: 152 mg/dL — ABNORMAL HIGH (ref 70–99)

## 2021-07-31 SURGERY — CYSTOSCOPY, WITH BLADDER FULGURATION
Anesthesia: General | Site: Bladder

## 2021-07-31 MED ORDER — CEFAZOLIN SODIUM-DEXTROSE 2-4 GM/100ML-% IV SOLN
2.0000 g | Freq: Once | INTRAVENOUS | Status: AC
  Administered 2021-07-31: 2 g via INTRAVENOUS

## 2021-07-31 MED ORDER — PROPOFOL 10 MG/ML IV BOLUS
INTRAVENOUS | Status: DC | PRN
  Administered 2021-07-31: 20 mg via INTRAVENOUS
  Administered 2021-07-31: 100 mg via INTRAVENOUS

## 2021-07-31 MED ORDER — FENTANYL CITRATE (PF) 100 MCG/2ML IJ SOLN
INTRAMUSCULAR | Status: AC
  Filled 2021-07-31: qty 2

## 2021-07-31 MED ORDER — STERILE WATER FOR IRRIGATION IR SOLN
Status: DC | PRN
  Administered 2021-07-31: 3000 mL

## 2021-07-31 MED ORDER — ONDANSETRON HCL 4 MG/2ML IJ SOLN: 4.0000 mg | Freq: Once | INTRAMUSCULAR | Status: DC | PRN

## 2021-07-31 MED ORDER — ACETAMINOPHEN 10 MG/ML IV SOLN: 1000.0000 mg | Freq: Once | INTRAVENOUS | Status: DC | PRN

## 2021-07-31 MED ORDER — DEXAMETHASONE SODIUM PHOSPHATE 10 MG/ML IJ SOLN
INTRAMUSCULAR | Status: DC | PRN
Start: 2021-07-31 — End: 2021-07-31
  Administered 2021-07-31: 5 mg via INTRAVENOUS

## 2021-07-31 MED ORDER — CEFAZOLIN SODIUM-DEXTROSE 2-4 GM/100ML-% IV SOLN
INTRAVENOUS | Status: AC
  Filled 2021-07-31: qty 100

## 2021-07-31 MED ORDER — GEMCITABINE CHEMO FOR BLADDER INSTILLATION 2000 MG
2000.0000 mg | Freq: Once | INTRAVENOUS | Status: AC
  Administered 2021-07-31: 2000 mg via INTRAVESICAL
  Filled 2021-07-31: qty 2000

## 2021-07-31 MED ORDER — FENTANYL CITRATE (PF) 250 MCG/5ML IJ SOLN
INTRAMUSCULAR | Status: DC | PRN
  Administered 2021-07-31: 50 ug via INTRAVENOUS
  Administered 2021-07-31: 25 ug via INTRAVENOUS

## 2021-07-31 MED ORDER — DEXMEDETOMIDINE HCL IN NACL 400 MCG/100ML IV SOLN
INTRAVENOUS | Status: DC | PRN
  Administered 2021-07-31: 4 ug via INTRAVENOUS

## 2021-07-31 MED ORDER — LACTATED RINGERS IV SOLN: INTRAVENOUS | Status: DC

## 2021-07-31 MED ORDER — PROPOFOL 10 MG/ML IV BOLUS
INTRAVENOUS | Status: AC
  Filled 2021-07-31: qty 20

## 2021-07-31 MED ORDER — SODIUM CHLORIDE (PF) 0.9 % IJ SOLN: 80.0000 mg | Freq: Once | INTRAMUSCULAR | Status: DC

## 2021-07-31 MED ORDER — PROPOFOL 500 MG/50ML IV EMUL
INTRAVENOUS | Status: AC
  Filled 2021-07-31: qty 50

## 2021-07-31 MED ORDER — ONDANSETRON HCL 4 MG/2ML IJ SOLN
INTRAMUSCULAR | Status: DC | PRN
Start: 2021-07-31 — End: 2021-07-31
  Administered 2021-07-31: 4 mg via INTRAVENOUS

## 2021-07-31 MED ORDER — FENTANYL CITRATE (PF) 100 MCG/2ML IJ SOLN
25.0000 ug | INTRAMUSCULAR | Status: DC | PRN
  Administered 2021-07-31: 25 ug via INTRAVENOUS

## 2021-07-31 MED ORDER — LIDOCAINE 2% (20 MG/ML) 5 ML SYRINGE
INTRAMUSCULAR | Status: DC | PRN
Start: 2021-07-31 — End: 2021-07-31
  Administered 2021-07-31: 80 mg via INTRAVENOUS

## 2021-07-31 MED ORDER — PROPOFOL 500 MG/50ML IV EMUL
INTRAVENOUS | Status: DC | PRN
Start: 2021-07-31 — End: 2021-07-31
  Administered 2021-07-31: 125 ug/kg/min via INTRAVENOUS

## 2021-07-31 SURGICAL SUPPLY — 22 items
BAG DRAIN URO-CYSTO SKYTR STRL (DRAIN) ×2 IMPLANT
BAG DRN RND TRDRP ANRFLXCHMBR (UROLOGICAL SUPPLIES)
BAG DRN UROCATH (DRAIN) ×1
BAG URINE DRAIN 2000ML AR STRL (UROLOGICAL SUPPLIES) IMPLANT
BAG URINE LEG 500ML (DRAIN) IMPLANT
CATH FOLEY 2WAY SLVR  5CC 16FR (CATHETERS)
CATH FOLEY 2WAY SLVR 5CC 16FR (CATHETERS) IMPLANT
CLOTH BEACON ORANGE TIMEOUT ST (SAFETY) ×2 IMPLANT
DRSG TELFA 3X8 NADH (GAUZE/BANDAGES/DRESSINGS) ×2 IMPLANT
ELECT REM PT RETURN 9FT ADLT (ELECTROSURGICAL) ×2
ELECTRODE REM PT RTRN 9FT ADLT (ELECTROSURGICAL) ×1 IMPLANT
GLOVE SURG ENC MOIS LTX SZ7.5 (GLOVE) ×2 IMPLANT
GLOVE SURG ENC MOIS LTX SZ8 (GLOVE) IMPLANT
GOWN STRL REUS W/TWL LRG LVL3 (GOWN DISPOSABLE) ×2 IMPLANT
KIT TURNOVER CYSTO (KITS) ×2 IMPLANT
MANIFOLD NEPTUNE II (INSTRUMENTS) ×2 IMPLANT
NEEDLE HYPO 22GX1.5 SAFETY (NEEDLE) IMPLANT
NS IRRIG 500ML POUR BTL (IV SOLUTION) IMPLANT
PACK CYSTO (CUSTOM PROCEDURE TRAY) ×2 IMPLANT
PAD DRESSING TELFA 3X8 NADH (GAUZE/BANDAGES/DRESSINGS) IMPLANT
TUBE CONNECTING 12X1/4 (SUCTIONS) ×2 IMPLANT
WATER STERILE IRR 3000ML UROMA (IV SOLUTION) ×2 IMPLANT

## 2021-07-31 NOTE — Anesthesia Procedure Notes (Signed)
Procedure Name: LMA Insertion ?Date/Time: 07/31/2021 7:34 AM ?Performed by: Clearnce Sorrel, CRNA ?Pre-anesthesia Checklist: Patient identified, Emergency Drugs available, Suction available and Patient being monitored ?Patient Re-evaluated:Patient Re-evaluated prior to induction ?Oxygen Delivery Method: Circle System Utilized ?Preoxygenation: Pre-oxygenation with 100% oxygen ?Induction Type: IV induction ?Ventilation: Mask ventilation without difficulty ?LMA: LMA inserted ?LMA Size: 4.0 ?Number of attempts: 1 ?Airway Equipment and Method: Bite block ?Placement Confirmation: positive ETCO2 ?Tube secured with: Tape ?Dental Injury: Teeth and Oropharynx as per pre-operative assessment  ? ? ? ? ?

## 2021-07-31 NOTE — Discharge Instructions (Addendum)

## 2021-07-31 NOTE — H&P (Signed)
H&P ? ?Chief Complaint: Bladder cancer ? ?History of Present Illness: Meagan Osborne is a 76 year old female with a history of low-grade and high-grade TA bladder cancer over the past few years.  Office cystoscopy February 2023 revealed some small papillary posterior recurrence.  She had normal retrogrades June 2022.  She has been well.  No cough, congestion or fever.  No dysuria or gross hematuria. ? ?Past Medical History:  ?Diagnosis Date  ? Allergic genetic state   ? Anxiety   ? Arthritis   ? B12 deficiency   ? Barrett esophagus   ? followed by GI  ? Bladder cancer ([place])   ? urologist--- dr Junious Silk;   s/p TURBT x2  ? Chronic low back pain   ? Complication of anesthesia   ? PER PT WITH SURGERY AT [facility] 10-14-2020  PT STATED HAD ISSUE WITH LOW O2 SAT DISCHARGE AT HOME IN THE 80s FOR 24 HOURS  ? Coronary artery disease   ? cardiologist--- dr Garret Reddish sketch ([facility] [place])  10/ 2008  s/p PTCA w/ DES to mid LAD;   09/ 2009  s/p PTCA balloon to ISR;  nuclear stress test 01-08-2021 in care everywhere, normal perfusion no ischemia, nuclear ef 52%  ? Diet-controlled type 2 diabetes mellitus ([place])   ? Difficult intubation   ? per pt was told at time of carotid surgery had crooked airway (2011 @ [facility]);  refer to anesthesia record from back surgery @ [facility] 06-07-2011 used glidescop without issues  ? Diverticulosis   ? Fatty liver   ? Fibrocystic breast disease   ? GERD (gastroesophageal reflux disease)   ? Hiatal hernia   ? History of COVID-19 04/2021  ? per pt mild symptoms that resolved  ? History of lower GI bleeding 2013  ? hospital admission--  retroperitoneal bleed in setting of taking plavix,  post blood transfusion,  ? Hyperlipidemia, mixed   ? Hypertension   ? Lower urinary tract symptoms (LUTS)   ? Multilevel spondylosis   ? Osteoporosis   ? PAD (peripheral artery disease) ([place])   ? s/p right CEA in 2011  ? PONV (postoperative nausea and vomiting)   ? Psoriasis   ? PVC's (premature ventricular contractions)   ? Rectal  sphincter incontinence   ? followed by GI-- dr [name];  per pt occasional intermittant  ? S/P drug eluting coronary stent placement   ? 1998  to midLAD and balloon angioplasty to [place]  ? Urinary incontinence   ? at night  ? ?Past Surgical History:  ?Procedure Laterality Date  ? BELPHAROPTOSIS REPAIR Bilateral 2014  ? CARDIAC CATHETERIZATION  09/17/2008  ? '@[facility]'$  ;  20% mRCA, 20% ostial RCA, 10% OM2,  patent previous stents, ef 68%  ? CARDIAC CATHETERIZATION    ? 05-14-2010;  09-09-2011;  lastt one 07-30-2014 '@[facility] , all show , patent stents and no change since previous cath  ? CAROTID ENDARTERECTOMY Right 07/10/2009  ? '@[facility]'$   ? CARPAL TUNNEL RELEASE Right 2005  ? CATARACT EXTRACTION W/ INTRAOCULAR LENS  IMPLANT, BILATERAL  2015  ? CHOLECYSTECTOMY OPEN  1990  ? W/  APPENDECTOMY  ? COLONOSCOPY WITH PROPOFOL N/A 03/09/2017  ? Procedure: COLONOSCOPY WITH PROPOFOL;  Surgeon: Manya Silvas, MD;  Location: [facility] ENDOSCOPY;  Service: Endoscopy;  Laterality: N/A;  ? CORONARY ANGIOPLASTY WITH STENT PLACEMENT  02/2007  ? '@[facility]'$ ;   DES X1 to mLAD  ? CORONARY ANGIOPLASTY WITH STENT PLACEMENT  01/2008  ? '@[facility]  ;  DES X1  to mLAD  ?  CYSTOSCOPY WITH BIOPSY N/A 05/16/2015  ? Procedure: CYSTOSCOPY WITH BIOPSY AND FULGERATION ;  Surgeon: Festus Aloe, MD;  Location: [facility];  Service: Urology;  Laterality: N/A;  ? ESOPHAGOGASTRODUODENOSCOPY (EGD) WITH PROPOFOL N/A 04/07/2020  ? Procedure: ESOPHAGOGASTRODUODENOSCOPY (EGD) WITH PROPOFOL;  Surgeon: [place], Benay Pike, MD;  Location: [facility] ENDOSCOPY;  Service: Gastroenterology;  Laterality: N/A;  ? EYE SURGERY  07/2013  ? excision cyst right eyelid  ? LUMBAR LAMINECTOMY/DECOMPRESSION MICRODISCECTOMY  06/07/2011  ? Procedure: LUMBAR LAMINECTOMY/DECOMPRESSION MICRODISCECTOMY 2 LEVELS;  Surgeon: Elaina Hoops, MD;  Location: [facility];  Service: Neurosurgery;  Laterality: Left;   Lumbar Two-Three, Lumbar Three-Four Decompressive Lumbar Laminectomy  ? SHOULDER  ARTHROSCOPY WITH OPEN ROTATOR CUFF REPAIR Right 02/20/2015  ? Procedure: SHOULDER ARTHROSCOPY WITH OPEN ROTATOR CUFF REPAIR and decompression;  Surgeon: Corky Mull, MD;  Location: [facility];  Service: Orthopedics;  Laterality: Right;  ? [place]  ? TRANSURETHRAL RESECTION OF BLADDER TUMOR N/A 10/14/2020  ? Procedure: TRANSURETHRAL RESECTION OF BLADDER TUMOR (TURBT)/ CYSTOSCOPY/ POST OPERATIVE INSTILLATION OF GEMCITABINE/ BILATERAL RETROGRADE;  Surgeon: Festus Aloe, MD;  Location: [facility];  Service: Urology;  Laterality: N/A;  ONLY NEEDS 60 MIN  ? TRANSURETHRAL RESECTION OF BLADDER TUMOR WITH MITOMYCIN-C N/A 02/05/2014  ? Procedure: TRANSURETHRAL RESECTION OF BLADDER TUMOR ;  Surgeon: Festus Aloe, MD;  Location[facility];  Service: Urology;  Laterality: N/A;  ? VAGINAL HYSTERECTOMY  1976  ? ? ?Home Medications:  ?Medications Prior to Admission  ?Medication Sig Dispense Refill Last Dose  ? acetaminophen (TYLENOL) 500 MG tablet Take 1,000 mg by mouth every 6 (six) hours as needed for moderate pain.   07/30/2021  ? amLODipine (NORVASC) 5 MG tablet Take 5 mg by mouth at bedtime.   07/30/2021  ? aspirin EC 81 MG tablet Take 81 mg by mouth daily.   07/25/2021  ? atorvastatin (LIPITOR) 40 MG tablet Take 40 mg by mouth every evening.   07/30/2021  ? benazepril (LOTENSIN) 40 MG tablet Take 40 mg by mouth every morning.   07/30/2021  ? Camphor-Menthol-Methyl Sal (SALONPAS) 3.05-15-08 % PTCH Place 1 patch onto the skin daily as needed (Pain).   Past Month  ? carvedilol (COREG) 25 MG tablet Take 25 mg by mouth 2 (two) times daily with a meal.   07/31/2021 at 0430  ? escitalopram (LEXAPRO) 10 MG tablet Take 10 mg by mouth at bedtime.   07/30/2021  ? esomeprazole (NEXIUM) 40 MG capsule Take 40 mg by mouth at bedtime.   07/31/2021  ? fluocinonide (LIDEX) 0.05 % external solution Apply to aa's scalp QHS up to 5 days per week. Avoid face, groin, and axilla. 60 mL 11 Past Week  ?  fluticasone (FLONASE) 50 MCG/ACT nasal spray Place 2 sprays into both nostrils daily as needed for allergies.   Past Month  ? Multiple Vitamins-Minerals (MULTIVITAMINS THER. W/MINERALS) TABS Take 1 tablet by mouth daily.   Past Month  ? nitroGLYCERIN (NITROSTAT) 0.4 MG SL tablet Place 0.4 mg under the tongue every 5 (five) minutes as needed. For chest pain   Unknown  ? ondansetron (ZOFRAN) 4 MG tablet Take 1 tablet (4 mg total) by mouth every 8 (eight) hours as needed for nausea or vomiting. (Patient not taking: Reported on 07/29/2021) 20 tablet 0 Not Taking  ? ?Allergies:  ?Allergies  ?Allergen Reactions  ? Shellfish Allergy Anaphylaxis and Hives  ?  ALL SHELLFISH  ? Aciphex [Rabeprazole Sodium] Other (See Comments)  ?  Migraines  ? Biaxin [Clarithromycin] Hives and Swelling  ? Codeine Other (See Comments)  ?  insomnia  ? Colchicine Other (See Comments)  ?  UNKNOWN  ? Evista [Raloxifene]   ? Levaquin [Levofloxacin In D5w] Other (See Comments)  ?  insomnia  ? Losartan Swelling  ? Myrbetriq [Mirabegron] Other (See Comments)  ?  DID NOT WORK  ? Prevacid [Lansoprazole] Diarrhea  ?  "it didn't work"  ? Roxicodone [Oxycodone] Nausea And Vomiting  ?  SEVERE  ? Sulfa Antibiotics Hives  ? Tetanus Toxoids Other (See Comments)  ?  Red, swelling  ? Zoloft [Sertraline Hcl] Other (See Comments)  ?  Deep depression  ? Amoxicillin Rash  ? ? ?Family History  ?Problem Relation Age of Onset  ? Anesthesia problems Neg Hx   ? Hypotension Neg Hx   ? Malignant hyperthermia Neg Hx   ? Pseudochol deficiency Neg Hx   ? ?Social History:  reports that she quit smoking about 22 years ago. Her smoking use included cigarettes. She has a 1.25 pack-year smoking history. She has never used smokeless tobacco. She reports that she does not drink alcohol and does not use drugs. ? ?ROS: ?A complete review of systems was performed.  All systems are negative except for pertinent findings as noted. ?Review of Systems  ?All other systems reviewed and are  negative. ? ? ?Physical Exam:  ?Vital signs in last 24 hours: ?Temp:  [98.7 ?F (37.1 ?C)] 98.7 ?F (37.1 ?C) (03/24 0546) ?Pulse Rate:  [64] 64 (03/24 0546) ?Resp:  [20] 20 (03/24 0546) ?BP: (148)/(71) 148/71 (03/24 0

## 2021-07-31 NOTE — Transfer of Care (Signed)
Immediate Anesthesia Transfer of Care Note ? ?Patient: Meagan Osborne ? ?Procedure(s) Performed: CYSTOSCOPY WITH FULGERATION/ BLADDER BIOPSY/ POSTOPERATIVE INSTILLATION OF EPIRUBICIN (Bladder) ? ?Patient Location: PACU ? ?Anesthesia Type:General ? ?Level of Consciousness: drowsy ? ?Airway & Oxygen Therapy: Patient Spontanous Breathing and Patient connected to nasal cannula oxygen ? ?Post-op Assessment: Report given to RN and Post -op Vital signs reviewed and stable ? ?Post vital signs: Reviewed and stable ? ?Last Vitals:  ?Vitals Value Taken Time  ?BP 88/49 07/31/21 0813  ?Temp    ?Pulse 57 07/31/21 0815  ?Resp 12 07/31/21 0815  ?SpO2 96 % 07/31/21 0815  ?Vitals shown include unvalidated device data. ? ?Last Pain:  ?Vitals:  ? 07/31/21 0546  ?TempSrc: Oral  ?PainSc: 0-No pain  ?   ? ?Patients Stated Pain Goal: 4 (07/31/21 0546) ? ?Complications: No notable events documented. ?

## 2021-07-31 NOTE — Anesthesia Postprocedure Evaluation (Signed)
Anesthesia Post Note ? ?Patient: Meagan Osborne ? ?Procedure(s) Performed: CYSTOSCOPY WITH FULGERATION/ BLADDER BIOPSY/ POSTOPERATIVE INSTILLATION OF EPIRUBICIN (Bladder) ? ?  ? ?Patient location during evaluation: PACU ?Anesthesia Type: General ?Level of consciousness: awake and alert ?Pain management: pain level controlled ?Vital Signs Assessment: post-procedure vital signs reviewed and stable ?Respiratory status: spontaneous breathing, nonlabored ventilation, respiratory function stable and patient connected to nasal cannula oxygen ?Cardiovascular status: blood pressure returned to baseline and stable ?Postop Assessment: no apparent nausea or vomiting ?Anesthetic complications: no ? ? ?No notable events documented. ? ?Last Vitals:  ?Vitals:  ? 07/31/21 0830 07/31/21 0845  ?BP: 120/68   ?Pulse: (!) 55   ?Resp: 14   ?Temp:  36.5 ?C  ?SpO2: 95%   ?  ?Last Pain:  ?Vitals:  ? 07/31/21 0900  ?TempSrc:   ?PainSc: 8   ? ? ?  ?  ?  ?  ?  ?  ? ?[place] ? ? ? ? ?

## 2021-07-31 NOTE — Op Note (Addendum)
Preoperative diagnosis: Bladder cancer ?Postoperative diagnosis: Bladder cancer ? ?Procedure: 1) cystoscopy with bladder biopsy and fulguration 0.5 to 2 cm, 2) postoperative instillation of gemcitabine in PACU ? ?Surgeon: Junious Silk ? ?Anesthesia: General ? ?Indication for procedure Shere is a 76 year old female with a history of low-grade and high-grade TA bladder tumors.  She had a couple of small recurrences in the posterior bladder on recent office cystoscopy. ? ?Findings: On exam under anesthesia the vulva appeared normal without lesion.  She had a grade 2 rectocele.  Small urethrocele bladder and urethra palpably normal. ? ?On cystoscopy the urethra was unremarkable, trigone and ureteral orifice ease were in their normal orthotopic position with clear reflux.  No stone or foreign body in the bladder.  There were 2 small areas of papillary tumor in the posterior bladder which appeared superficial and only adherent to the overlying mucosa. ? ?Description of procedure: After consent was obtained patient brought to the operating room.  After adequate anesthesia she was placed in lithotomy position and prepped and draped in the usual sterile fashion.  Hibiclens was used.  Cystoscope was passed per urethra the bladder carefully inspected with 30 degree lens.  I could see the bladder neck very well with a 30.  Cold cup biopsy forceps were used to take serial biopsies of the 2 posterior bladder tumors which were removed them in their entirety.  All specimen sent together.  The biopsy sites were fulgurated with the largest being about 15 mm.  Hemostasis was excellent under low pressure.  Scope was removed.  A 14 French Foley catheter was placed.  She was awakened taken recovery room in stable condition. ? ?Instillation of gemcitabine in PACU: Gemcitabine 2000 mg was instilled per urethra into the bladder and left indwelling for 50 minutes.  It was drained and the Foley removed. ? ?Complications: None ? ?Blood loss:  Minimal ? ?Specimens: Posterior bladder biopsy to pathology ? ?Drains: 43 French Foley catheter ? ?Disposition: Patient stable to PACU ?

## 2021-08-03 ENCOUNTER — Encounter (HOSPITAL_BASED_OUTPATIENT_CLINIC_OR_DEPARTMENT_OTHER): Payer: Self-pay | Admitting: Urology

## 2021-08-03 LAB — SURGICAL PATHOLOGY

## 2022-01-07 ENCOUNTER — Ambulatory Visit (INDEPENDENT_AMBULATORY_CARE_PROVIDER_SITE_OTHER): Payer: Medicare Other | Admitting: Dermatology

## 2022-01-07 DIAGNOSIS — Z79899 Other long term (current) drug therapy: Secondary | ICD-10-CM | POA: Diagnosis not present

## 2022-01-07 DIAGNOSIS — L409 Psoriasis, unspecified: Secondary | ICD-10-CM

## 2022-01-07 MED ORDER — CALCIPOTRIENE 0.005 % EX SOLN: 1.0000 | Freq: Every evening | CUTANEOUS | 3 refills | Status: AC

## 2022-01-07 MED ORDER — FLUOCINONIDE 0.05 % EX SOLN: CUTANEOUS | 3 refills | Status: AC

## 2022-01-07 NOTE — Progress Notes (Signed)
   Follow-Up Visit   Subjective  Meagan Osborne is a 76 y.o. 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 Currently using fluocinonide 0.05 % solution to scalp 5 days weekly. ).  The following portions of the chart were reviewed this encounter and updated as appropriate:  Tobacco  Allergies  Meds  Problems  Med Hx  Surg Hx  Fam Hx     Review of Systems: No other skin or systemic complaints except as noted in HPI or Assessment and Plan.  Objective  Well appearing patient in no apparent distress; mood and affect are within normal limits.  A focused examination was performed including head, including the scalp, face, neck, nose, ears, eyelids, and lips. Relevant physical exam findings are noted in the Assessment and Plan.  Scalp Thickening and scale at scalp   Assessment & Plan  Psoriasis Scalp  Chronic and persistent condition with duration or expected duration over one year. Condition is symptomatic / bothersome to patient. Not to goal.    Psoriasis is a chronic non-curable, but treatable genetic/hereditary disease that may have other systemic features affecting other organ systems such as joints (Psoriatic Arthritis). It is associated with an increased risk of inflammatory bowel disease, heart disease, non-alcoholic fatty liver disease, and depression.    Start Calcipotriene 0.005 % external solution - apply to aa's scalp qhs up to 5 days / week. Can apply over Fluocinonide solution  Continue Fluocinonide sol to aa's QD up to 5d/wk.   Topical steroids (such as triamcinolone, fluocinolone, fluocinonide, mometasone, clobetasol, halobetasol, betamethasone, hydrocortisone) can cause thinning and lightening of the skin if they are used for too long in the same area. Your physician has selected the right strength medicine for your problem and area affected on the body. Please use your medication only as directed by your physician to prevent side  effects.   fluocinonide (LIDEX) 0.05 % external solution - Scalp Apply to aa's scalp QHS up to 5 days per week. Avoid face, groin, and axilla.  Calcipotriene 0.005 % solution - Scalp Apply 1 Application topically at bedtime. Apply up to 5 days per week can apply over Fluocinonide   Return in about 1 year (around 01/08/2023) for psoriasis. IRuthell Rummage, CMA, am acting as scribe for Sarina Ser, MD. Documentation: I have reviewed the above documentation for accuracy and completeness, and I agree with the above.  Sarina Ser, MD

## 2022-01-07 NOTE — Patient Instructions (Signed)
Due to recent changes in healthcare laws, you may see results of your pathology and/or laboratory studies on MyChart before the doctors have had a chance to review them. We understand that in some cases there may be results that are confusing or concerning to you. Please understand that not all results are received at the same time and often the doctors may need to interpret multiple results in order to provide you with the best plan of care or course of treatment. Therefore, we ask that you please give us 2 business days to thoroughly review all your results before contacting the office for clarification. Should we see a critical lab result, you will be contacted sooner.   If You Need Anything After Your Visit  If you have any questions or concerns for your doctor, please call our main line at [phone] and press option 4 to reach your doctor's medical assistant. If no one answers, please leave a voicemail as directed and we will return your call as soon as possible. Messages left after 4 pm will be answered the following business day.   You may also send us a message via MyChart. We typically respond to MyChart messages within 1-2 business days.  For prescription refills, please ask your pharmacy to contact our office. Our fax number is [phone].  If you have an urgent issue when the clinic is closed that cannot wait until the next business day, you can page your doctor at the number below.    Please note that while we do our best to be available for urgent issues outside of office hours, we are not available 24/7.   If you have an urgent issue and are unable to reach us, you may choose to seek medical care at your doctor's office, retail clinic, urgent care center, or emergency room.  If you have a medical emergency, please immediately call 911 or go to the emergency department.  Pager Numbers  - Dr. Kowalski: [phone]  - Dr. Moye: [phone]  - Dr. Stewart:  [phone]  In the event of inclement weather, please call our main line at [phone] for an update on the status of any delays or closures.  Dermatology Medication Tips: Please keep the boxes that topical medications come in in order to help keep track of the instructions about where and how to use these. Pharmacies typically print the medication instructions only on the boxes and not directly on the medication tubes.   If your medication is too expensive, please contact our office at [phone] option 4 or send us a message through MyChart.   We are unable to tell what your co-pay for medications will be in advance as this is different depending on your insurance coverage. However, we may be able to find a substitute medication at lower cost or fill out paperwork to get insurance to cover a needed medication.   If a prior authorization is required to get your medication covered by your insurance company, please allow us 1-2 business days to complete this process.  Drug prices often vary depending on where the prescription is filled and some pharmacies may offer cheaper prices.  The website www.goodrx.com contains coupons for medications through different pharmacies. The prices here do not account for what the cost may be with help from insurance (it may be cheaper with your insurance), but the website can give you the price if you did not use any insurance.  - You can print the associated coupon and take it with   your prescription to the pharmacy.  - You may also stop by our office during regular business hours and pick up a GoodRx coupon card.  - If you need your prescription sent electronically to a different pharmacy, notify our office through [facility] MyChart or by phone at [phone] option 4.     Si Usted Necesita Algo Despus de Su Visita  Tambin puede enviarnos un mensaje a travs de MyChart. Por lo general respondemos a los mensajes de MyChart en el transcurso de 1 a 2  das hbiles.  Para renovar recetas, por favor pida a su farmacia que se ponga en contacto con nuestra oficina. Nuestro nmero de fax es el [phone].  Si tiene un asunto urgente cuando la clnica est cerrada y que no puede esperar hasta el siguiente da hbil, puede llamar/localizar a su doctor(a) al nmero que aparece a continuacin.   Por favor, tenga en cuenta que aunque hacemos todo lo posible para estar disponibles para asuntos urgentes fuera del horario de oficina, no estamos disponibles las 24 horas del da, los 7 das de la semana.   Si tiene un problema urgente y no puede comunicarse con nosotros, puede optar por buscar atencin mdica  en el consultorio de su doctor(a), en una clnica privada, en un centro de atencin urgente o en una sala de emergencias.  Si tiene una emergencia mdica, por favor llame inmediatamente al 911 o vaya a la sala de emergencias.  Nmeros de bper  - Dr. Kowalski: [phone]  - Dra. Moye: [phone]  - Dra. Stewart: [phone]  En caso de inclemencias del tiempo, por favor llame a nuestra lnea principal al [phone] para una actualizacin sobre el estado de cualquier retraso o cierre.  Consejos para la medicacin en dermatologa: Por favor, guarde las cajas en las que vienen los medicamentos de uso tpico para ayudarle a seguir las instrucciones sobre dnde y cmo usarlos. Las farmacias generalmente imprimen las instrucciones del medicamento slo en las cajas y no directamente en los tubos del medicamento.   Si su medicamento es muy caro, por favor, pngase en contacto con nuestra oficina llamando al [phone] y presione la opcin 4 o envenos un mensaje a travs de MyChart.   No podemos decirle cul ser su copago por los medicamentos por adelantado ya que esto es diferente dependiendo de la cobertura de su seguro. Sin embargo, es posible que podamos encontrar un medicamento sustituto a menor costo o llenar un formulario para que el  seguro cubra el medicamento que se considera necesario.   Si se requiere una autorizacin previa para que su compaa de seguros cubra su medicamento, por favor permtanos de 1 a 2 das hbiles para completar este proceso.  Los precios de los medicamentos varan con frecuencia dependiendo del lugar de dnde se surte la receta y alguna farmacias pueden ofrecer precios ms baratos.  El sitio web www.goodrx.com tiene cupones para medicamentos de diferentes farmacias. Los precios aqu no tienen en cuenta lo que podra costar con la ayuda del seguro (puede ser ms barato con su seguro), pero el sitio web puede darle el precio si no utiliz ningn seguro.  - Puede imprimir el cupn correspondiente y llevarlo con su receta a la farmacia.  - Tambin puede pasar por nuestra oficina durante el horario de atencin regular y recoger una tarjeta de cupones de GoodRx.  - Si necesita que su receta se enve electrnicamente a una farmacia diferente, informe a nuestra oficina a travs de MyChart de [facility]   o por telfono llamando al [phone] y presione la opcin 4.  

## 2022-01-15 ENCOUNTER — Encounter: Payer: Self-pay | Admitting: Dermatology

## 2022-03-10 ENCOUNTER — Ambulatory Visit: Admit: 2022-03-10 | Payer: Medicare Other | Admitting: Internal Medicine

## 2022-03-10 SURGERY — COLONOSCOPY
Anesthesia: General

## 2022-06-17 ENCOUNTER — Other Ambulatory Visit: Payer: Self-pay | Admitting: Urology

## 2022-06-21 ENCOUNTER — Other Ambulatory Visit: Payer: Self-pay | Admitting: Urology

## 2022-07-05 ENCOUNTER — Encounter (HOSPITAL_BASED_OUTPATIENT_CLINIC_OR_DEPARTMENT_OTHER): Payer: Self-pay | Admitting: Urology

## 2022-07-06 ENCOUNTER — Encounter (HOSPITAL_BASED_OUTPATIENT_CLINIC_OR_DEPARTMENT_OTHER): Payer: Self-pay | Admitting: Urology

## 2022-07-06 NOTE — Progress Notes (Signed)
Spoke w/ via phone for pre-op interview--- pt Lab needs dos---- [facility], ekg              Lab results------ no COVID test -----patient states asymptomatic no test needed Arrive at ------- 0530 on 07-09-2022 NPO after MN NO Solid Food.  Clear liquids from MN until--- 0430 Med rec completed Medications to take morning of surgery ----- coreg Diabetic medication ----- n/a Patient instructed no nail polish to be worn day of surgery Patient instructed to bring photo id and insurance card day of surgery Patient aware to have Driver (ride ) / caregiver    for 24 hours after surgery -- husband, gene Patient Special Instructions ----- n/a Pre-Op special Istructions ----- n/a Patient verbalized understanding of instructions that were given at this phone interview. Patient denies shortness of breath, chest pain, fever, cough at this phone interview.   Anesthesia Review: HTN;  CAD s/p PCI w/ DES to mLAD in 2008 and 2009;  s/p right CEA 2011;  diet controlled DM2;  fatty liver (normal cmp in epic 12-18-2021);  note in epic hx pt difficult airway, pt told crooked airway in 2011 but pt did fine glidescope used '@[facility]'$   06-07-2011 & no issues with surgery '@[facility]'$  10-14-2020, @ [facility] 07-31-2021 Pt denies cardiac s&s, sob, and no peripheral swelling. Stated last taken a nitro > 3 yrs ago.  PCP: DR K. Lomthavong Cassell Clement 12-25-2021 epic) Cardiologist : Dr Jerilynn Mages. Sketch Cassell Clement 01-06-2022 epic) Chest x-ray : no EKG : 08-02-2021 epic Echo : 07-30-2014 CE Stress test: 01-08-2021 CE Cardiac Cath :  07-30-2014 CE# Activity level:  denies sob w/ any activity Sleep Study/ CPAP : no Fasting Blood Sugar :      / Checks Blood Sugar -- times a day:  does not check Blood Thinner/ Instructions /Last Dose: no ASA / Instructions/ Last Dose : ASA '81mg'$ 

## 2022-07-08 NOTE — Anesthesia Preprocedure Evaluation (Addendum)
Anesthesia Evaluation  Patient identified by MRN, date of birth, ID band  Reviewed: Allergy & Precautions, NPO status , Patient's Chart, lab work & pertinent test results  History of Anesthesia Complications (+) PONV, DIFFICULT AIRWAY and history of anesthetic complications  Airway Mallampati: II  TM Distance: >3 FB Neck ROM: Full    Dental no notable dental hx. (+) Teeth Intact, Dental Advisory Given   Pulmonary former smoker   Pulmonary exam normal breath sounds clear to auscultation       Cardiovascular hypertension, + CAD and + Cardiac Stents  Normal cardiovascular exam Rhythm:Regular Rate:Normal     Neuro/Psych   Anxiety     CVA    GI/Hepatic Neg liver ROS, hiatal hernia,GERD  ,,  Endo/Other  diabetes    Renal/GU   Female GU complaint (hx of bladder CA)     Musculoskeletal  (+) Arthritis ,    Abdominal   Peds  Hematology   Anesthesia Other Findings ALL: See list  Reproductive/Obstetrics                             Anesthesia Physical Anesthesia Plan  ASA: 3  Anesthesia Plan: General   Post-op Pain Management:    Induction: Intravenous  PONV Risk Score and Plan: 4 or greater and Treatment may vary due to age or medical condition, Ondansetron, Dexamethasone and TIVA  Airway Management Planned: LMA  Additional Equipment: None  Intra-op Plan:   Post-operative Plan:   Informed Consent: I have reviewed the patients History and Physical, chart, labs and discussed the procedure including the risks, benefits and alternatives for the proposed anesthesia with the patient or authorized representative who has indicated his/her understanding and acceptance.     Dental advisory given  Plan Discussed with: CRNA and Anesthesiologist  Anesthesia Plan Comments:         Anesthesia Quick Evaluation

## 2022-07-08 NOTE — H&P (Signed)
Office Visit Report     06/16/2022   --------------------------------------------------------------------------------   Meagan Osborne  MRN: [id number]  DOB: May 21, 1945, 77 year old Female  SSN: [id number]   PRIMARY CARE:  Dion Body, MD  PRIMARY CARE FAX:  [phone]  REFERRING:  Mortimer Fries, PA-C  PROVIDER:  Festus Aloe, M.D.  LOCATION:  [facility] [phone]     --------------------------------------------------------------------------------   CC/HPI: F/u -   1) H/o bladder cancer -   Biopsy:  -Sept 2015 - high-grade TA (majority low-grade, focal high-grade) left bladder s/p TURBT with [facility]. Nl EUA. Presented with microscopic hematuria and irritative symptoms.  - Jan 2017 - small LG Ta right anterior  -Jun 2022 - small LG Ta with gemcitabine - right BN and right dome  -Mar 2023 LG ta with gemcitabine   Staging/upper tract:  Sept 2015 CT hematuria  Dec 2016 CT hematuria  Jun 2021 renal US  06/22 bilateral RGP   Last cytology: Mar 2016 - negative   She can't take estrogens because of family history. No hematuria or LUTS. She has 2 small recurrences on the posterior bladder wall January 2023. Her 6 month cysto was ebnign but now today, Feb 2024, there are two small recurrences. One posterior and one dome.     ALLERGIES: Amoxicillin TABS Betadine Biaxin TABS losartan Shellfish-derived Products  Sulfa Drugs    MEDICATIONS: Amlodipine Besylate  Aspirin Ec 81 mg tablet, delayed release Oral  Atorvastatin Calcium 40 mg tablet Oral  Benazepril Hcl 40 mg tablet Oral  Carvedilol 25 mg tablet Oral  Flonase 50 mcg/actuation spray, suspension Nasal  Lexapro  Multivitamins tablet Oral  Nexium 40 mg capsule,delayed release Oral  Norvasc 10 mg tablet     GU PSH: Bladder Instill AntiCA Agent - 10/14/2020, 2015 Cysto Fulgurate < 0.5 cm - 2017 Cystoscopy - 12/09/2021, 06/10/2021, 10/01/2020, 2021, 2020, 2019, 2018, 2018, 2017 Cystoscopy TURBT <2  cm - 07/31/2021, 10/14/2020 Cystoscopy TURBT 2-5 cm - 2015 Hysterectomy Unilat SO - 2015       PSH Notes: Cystoscopy With Fulguration Minor Lesion (Under 62m), Rotator Cuff Repair, Bladder Injection Of Cancer Treatment, Cystoscopy With Fulguration Medium Lesion (2-5cm), Cath Stent Placement, Cholecystectomy, Hysterectomy, Heart Surgery, Back Surgery   NON-GU PSH: Cholecystectomy (open) - 2015     GU PMH: History of bladder cancer, I used a new HD scope today and got a very good look at her bladder. Looks good. - 12/09/2021, - 2019 Bladder Cancer Lateral, 3 small recurrences posterior bladder. Discussed the nature risk benefits and alternatives to cystoscopy bladder biopsy fulguration, postop EPIRUBICIN and she elects to proceed. - 06/10/2021, Disc stage, grade and prognosis. Check cystoscopy in 6 months. , - 10/31/2020, - 2018, - 2018, - 2017, Malignant neoplasm of lateral wall of urinary bladder, - 2017 Bladder, Neoplasm of Unspecified behavior, We discussed the nature r/b/a to cysto, TURBT, bilateral RGP, post-op gemcitabine. All questions answered and she elects to proceed. - 10/01/2020, Bladder neoplasm, - 2015 Gross hematuria, set up renal [place]- 2021 Dysuria - 2019 Urinary Urgency, Urinary urgency - 2017 Urinary Frequency, Increased urinary frequency - 2016 Other microscopic hematuria, Microhematuria - 2015      PMH Notes: compression fracture-thoracic-March 2021   NON-GU PMH: Encounter for general adult medical examination without abnormal findings, Encounter for preventive health examination - 2015 Anxiety, Anxiety - 2015 Personal history of other diseases of the circulatory system, History of cardiac disorder - 2015, History of hypertension, - 2015 Personal history  of other diseases of the digestive system, History of esophageal reflux - 2015 Personal history of other diseases of the musculoskeletal system and connective tissue, History of arthritis - 2015 Personal history of other  endocrine, nutritional and metabolic disease, History of hypercholesterolemia - 2015    FAMILY HISTORY: Dementia - Runs In Family lung disease - Runs In Family malignant neoplasm of urinary bladder - Runs In Family throat cancer - Runs In Family   SOCIAL HISTORY: Marital Status: Married Preferred Language: English; Race: White Current Smoking Status: Patient does not smoke anymore.  Drinks 2 caffeinated drinks per day.     Notes: Former smoker, Married, Retired, Three children, No alcohol use, Daily caffeine consumption, 2-3 servings a day   REVIEW OF SYSTEMS:    GU Review Female:   Patient denies frequent urination, hard to postpone urination, burning /pain with urination, get up at night to urinate, leakage of urine, stream starts and stops, trouble starting your stream, have to strain to urinate, and being pregnant.  Gastrointestinal (Upper):   Patient denies nausea, vomiting, and indigestion/ heartburn.  Gastrointestinal (Lower):   Patient denies diarrhea and constipation.  Constitutional:   Patient denies fever, night sweats, weight loss, and fatigue.  Skin:   Patient denies skin rash/ lesion and itching.  Eyes:   Patient denies blurred vision and double vision.  Ears/ Nose/ Throat:   Patient denies sore throat and sinus problems.  Hematologic/Lymphatic:   Patient denies swollen glands and easy bruising.  Cardiovascular:   Patient denies leg swelling and chest pains.  Respiratory:   Patient denies shortness of breath and cough.  Endocrine:   Patient denies excessive thirst.  Musculoskeletal:   Patient denies back pain and joint pain.  Neurological:   Patient denies headaches and dizziness.  Psychologic:   Patient denies depression and anxiety.   VITAL SIGNS: None   GU PHYSICAL EXAMINATION:    External Genitalia: No hirsutism, no rash, no scarring, no cyst, no erythematous lesion, no papular lesion, no blanched lesion, no warty lesion. No edema.  Urethral Meatus: Normal size.  Normal position. No discharge.  Urethra: No tenderness, no mass, no scarring. No hypermobility. No leakage.  Bladder: Normal to palpation, no tenderness, no mass, normal size.   MULTI-SYSTEM PHYSICAL EXAMINATION:    Constitutional: Well-nourished. No physical deformities. Normally developed. Good grooming.  Neck: Neck symmetrical, not swollen. Normal tracheal position.  Respiratory: No labored breathing, no use of accessory muscles.   Cardiovascular: Normal temperature, normal extremity pulses, no swelling, no varicosities.  Neurologic / Psychiatric: Oriented to time, oriented to place, oriented to person. No depression, no anxiety, no agitation.  Gastrointestinal: No mass, no tenderness, no rigidity, non obese abdomen.     PAST DATA REVIEW: None   PROCEDURES:         Flexible Cystoscopy - 52000  Risks, benefits, and some of the potential complications of the procedure were discussed at length with the patient including infection, bleeding, voiding discomfort, urinary retention, fever, chills, sepsis, and others. All questions were answered. Informed consent was obtained. Antibiotic prophylaxis was given. Sterile technique and intraurethral analgesia were used. Chaperone -brenda - for exam and cystoscopy.   Meatus:  Normal size. Normal location. Normal condition.  Urethra:  No hypermobility. No leakage.  Ureteral Orifices:  Normal location. Normal size. Normal shape. Effluxed clear urine.  Bladder:  No trabeculation. No stones. two small recurrences. One posterior and one dome.       The lower urinary tract was carefully examined.  The procedure was well-tolerated and without complications. Antibiotic instructions were given. Instructions were given to call the office immediately for bloody urine, difficulty urinating, urinary retention, painful or frequent urination, fever, chills, nausea, vomiting or other illness. The patient stated that she understood these instructions and would comply  with them.         Visit Complexity - G2211          Urinalysis w/Scope - 81001 Dipstick Dipstick Cont'd Micro  Color: Yellow Bilirubin: Neg mg/dL WBC/hpf: NS (Not Seen)  Appearance: Clear Ketones: Neg mg/dL RBC/hpf: NS (Not Seen)  Specific Gravity: 1.025 Blood: Neg ery/uL Bacteria: NS (Not Seen)  pH: <=5.0 Protein: Neg mg/dL Cystals: NS (Not Seen)  Glucose: Neg mg/dL Urobilinogen: 0.2 mg/dL Casts: NS (Not Seen)    Nitrites: Neg Trichomonas: Not Present    Leukocyte Esterase: Neg leu/uL Mucous: Not Present      Epithelial Cells: 0 - 5/hpf      Yeast: NS (Not Seen)      Sperm: Not Present    ASSESSMENT:      ICD-10 Details  1 GU:   Bladder Cancer Posterior - C67.4 Undiagnosed New Problem - We went over the nature risk benefits and alternatives to cystoscopy with bladder biopsy and postoperative gemcitabine. She will proceed. We should consider a course of BCG.   PLAN:           Schedule Return Visit/Planned Activity: Next Available Appointment - Schedule Surgery          Document Letter(s):  Created for Patient: Clinical Summary         Next Appointment:      Next Appointment: 07/09/2022 07:30 AM    Appointment Type: Surgery     Location: [facility] [phone]    Provider: Festus Aloe, M.D.    Reason for Visit: OP NE CYSTO BLADDER BX GEMCITABINE      * Signed by Festus Aloe, M.D. on 06/17/22 at 9:32 PM (EST)*      The information contained in this medical record document is considered private and confidential patient information. This information can only be used for the medical diagnosis and/or medical services that are being provided by the patient's selected caregivers. This information can only be distributed outside of the patient's care if the patient agrees and signs waivers of authorization for this information to be sent to an outside source or route.

## 2022-07-09 ENCOUNTER — Encounter (HOSPITAL_BASED_OUTPATIENT_CLINIC_OR_DEPARTMENT_OTHER): Payer: Self-pay | Admitting: Urology

## 2022-07-09 ENCOUNTER — Encounter (HOSPITAL_BASED_OUTPATIENT_CLINIC_OR_DEPARTMENT_OTHER)
Admission: RE | Disposition: A | Discharge status: Home / Self Care | Payer: Self-pay | Source: Home / Self Care | Attending: Urology

## 2022-07-09 ENCOUNTER — Ambulatory Visit (HOSPITAL_BASED_OUTPATIENT_CLINIC_OR_DEPARTMENT_OTHER)
Admission: RE | Admit: 2022-07-09 | Discharge: 2022-07-09 | Disposition: A | Discharge status: Home / Self Care | Payer: Medicare Other | Attending: Urology | Admitting: Urology

## 2022-07-09 ENCOUNTER — Ambulatory Visit (HOSPITAL_BASED_OUTPATIENT_CLINIC_OR_DEPARTMENT_OTHER): Payer: Medicare Other | Admitting: Anesthesiology

## 2022-07-09 DIAGNOSIS — M1990 Unspecified osteoarthritis, unspecified site: Secondary | ICD-10-CM | POA: Diagnosis not present

## 2022-07-09 DIAGNOSIS — D414 Neoplasm of uncertain behavior of bladder: Secondary | ICD-10-CM

## 2022-07-09 DIAGNOSIS — Z01818 Encounter for other preprocedural examination: Secondary | ICD-10-CM

## 2022-07-09 DIAGNOSIS — C671 Malignant neoplasm of dome of bladder: Secondary | ICD-10-CM | POA: Insufficient documentation

## 2022-07-09 DIAGNOSIS — I10 Essential (primary) hypertension: Secondary | ICD-10-CM

## 2022-07-09 DIAGNOSIS — Z8673 Personal history of transient ischemic attack (TIA), and cerebral infarction without residual deficits: Secondary | ICD-10-CM | POA: Insufficient documentation

## 2022-07-09 DIAGNOSIS — E119 Type 2 diabetes mellitus without complications: Secondary | ICD-10-CM

## 2022-07-09 DIAGNOSIS — D303 Benign neoplasm of bladder: Secondary | ICD-10-CM | POA: Diagnosis present

## 2022-07-09 DIAGNOSIS — I2510 Atherosclerotic heart disease of native coronary artery without angina pectoris: Secondary | ICD-10-CM | POA: Diagnosis not present

## 2022-07-09 DIAGNOSIS — C674 Malignant neoplasm of posterior wall of bladder: Secondary | ICD-10-CM | POA: Insufficient documentation

## 2022-07-09 DIAGNOSIS — C678 Malignant neoplasm of overlapping sites of bladder: Secondary | ICD-10-CM | POA: Diagnosis not present

## 2022-07-09 DIAGNOSIS — Z87891 Personal history of nicotine dependence: Secondary | ICD-10-CM | POA: Insufficient documentation

## 2022-07-09 DIAGNOSIS — Z955 Presence of coronary angioplasty implant and graft: Secondary | ICD-10-CM

## 2022-07-09 HISTORY — DX: Resistant hypertension: I1A.0

## 2022-07-09 HISTORY — PX: CYSTOSCOPY WITH BIOPSY: SHX5122

## 2022-07-09 LAB — POCT I-STAT, CHEM 8
BUN: 20 mg/dL (ref 8–23)
Calcium, Ion: 1.24 mmol/L (ref 1.15–1.40)
Chloride: 105 mmol/L (ref 98–111)
Creatinine, Ser: 0.6 mg/dL (ref 0.44–1.00)
Glucose, Bld: 141 mg/dL — ABNORMAL HIGH (ref 70–99)
HCT: 38 % (ref 36.0–46.0)
Hemoglobin: 12.9 g/dL (ref 12.0–15.0)
Potassium: 4.4 mmol/L (ref 3.5–5.1)
Sodium: 142 mmol/L (ref 135–145)
TCO2: 26 mmol/L (ref 22–32)

## 2022-07-09 SURGERY — CYSTOSCOPY, WITH BIOPSY
Anesthesia: General | Site: Bladder

## 2022-07-09 MED ORDER — EPHEDRINE SULFATE-NACL 50-0.9 MG/10ML-% IV SOSY
PREFILLED_SYRINGE | INTRAVENOUS | Status: DC | PRN
  Administered 2022-07-09: 5 mg via INTRAVENOUS

## 2022-07-09 MED ORDER — ONDANSETRON HCL 4 MG/2ML IJ SOLN
INTRAMUSCULAR | Status: DC | PRN
  Administered 2022-07-09: 4 mg via INTRAVENOUS

## 2022-07-09 MED ORDER — CEFAZOLIN SODIUM-DEXTROSE 2-4 GM/100ML-% IV SOLN
2.0000 g | Freq: Once | INTRAVENOUS | Status: AC
  Administered 2022-07-09: 2 g via INTRAVENOUS

## 2022-07-09 MED ORDER — FENTANYL CITRATE (PF) 100 MCG/2ML IJ SOLN
INTRAMUSCULAR | Status: DC | PRN
  Administered 2022-07-09: 50 ug via INTRAVENOUS
  Administered 2022-07-09 (×2): 25 ug via INTRAVENOUS

## 2022-07-09 MED ORDER — ONDANSETRON HCL 4 MG/2ML IJ SOLN: 4.0000 mg | Freq: Once | INTRAMUSCULAR | Status: DC | PRN

## 2022-07-09 MED ORDER — LACTATED RINGERS IV SOLN: INTRAVENOUS | Status: DC

## 2022-07-09 MED ORDER — ACETAMINOPHEN 160 MG/5ML PO SOLN: 325.0000 mg | ORAL | Status: DC | PRN

## 2022-07-09 MED ORDER — ACETAMINOPHEN 325 MG PO TABS: 325.0000 mg | ORAL_TABLET | ORAL | Status: DC | PRN

## 2022-07-09 MED ORDER — STERILE WATER FOR IRRIGATION IR SOLN
Status: DC | PRN
  Administered 2022-07-09: 3000 mL

## 2022-07-09 MED ORDER — PROPOFOL 10 MG/ML IV BOLUS
INTRAVENOUS | Status: AC
  Filled 2022-07-09: qty 20

## 2022-07-09 MED ORDER — DEXAMETHASONE SODIUM PHOSPHATE 10 MG/ML IJ SOLN
INTRAMUSCULAR | Status: DC | PRN
  Administered 2022-07-09: 8 mg via INTRAVENOUS

## 2022-07-09 MED ORDER — FENTANYL CITRATE (PF) 100 MCG/2ML IJ SOLN: 25.0000 ug | INTRAMUSCULAR | Status: DC | PRN

## 2022-07-09 MED ORDER — PHENAZOPYRIDINE HCL 100 MG PO TABS
ORAL_TABLET | ORAL | Status: AC
  Filled 2022-07-09: qty 2

## 2022-07-09 MED ORDER — FENTANYL CITRATE (PF) 100 MCG/2ML IJ SOLN
INTRAMUSCULAR | Status: AC
  Filled 2022-07-09: qty 2

## 2022-07-09 MED ORDER — GEMCITABINE CHEMO FOR BLADDER INSTILLATION 2000 MG
2000.0000 mg | Freq: Once | INTRAVENOUS | Status: AC
  Administered 2022-07-09: 2000 mg via INTRAVESICAL
  Filled 2022-07-09: qty 2000

## 2022-07-09 MED ORDER — LIDOCAINE 2% (20 MG/ML) 5 ML SYRINGE
INTRAMUSCULAR | Status: DC | PRN
  Administered 2022-07-09: 80 mg via INTRAVENOUS

## 2022-07-09 MED ORDER — OXYCODONE HCL 5 MG/5ML PO SOLN: 5.0000 mg | Freq: Once | ORAL | Status: DC | PRN

## 2022-07-09 MED ORDER — MEPERIDINE HCL 25 MG/ML IJ SOLN: 6.2500 mg | INTRAMUSCULAR | Status: DC | PRN

## 2022-07-09 MED ORDER — OXYCODONE HCL 5 MG PO TABS: 5.0000 mg | ORAL_TABLET | Freq: Once | ORAL | Status: DC | PRN

## 2022-07-09 MED ORDER — CEFAZOLIN SODIUM-DEXTROSE 2-4 GM/100ML-% IV SOLN
INTRAVENOUS | Status: AC
  Filled 2022-07-09: qty 100

## 2022-07-09 MED ORDER — PROPOFOL 10 MG/ML IV BOLUS
INTRAVENOUS | Status: DC | PRN
  Administered 2022-07-09: 150 mg via INTRAVENOUS

## 2022-07-09 MED ORDER — PHENAZOPYRIDINE HCL 100 MG PO TABS
200.0000 mg | ORAL_TABLET | Freq: Once | ORAL | Status: AC
  Administered 2022-07-09: 200 mg via ORAL

## 2022-07-09 SURGICAL SUPPLY — 21 items
BAG DRAIN URO-CYSTO SKYTR STRL (DRAIN) ×1 IMPLANT
BAG DRN UROCATH (DRAIN) ×1
CLOTH BEACON ORANGE TIMEOUT ST (SAFETY) ×1 IMPLANT
ELECT REM PT RETURN 9FT ADLT (ELECTROSURGICAL) ×1
ELECTRODE REM PT RTRN 9FT ADLT (ELECTROSURGICAL) ×1 IMPLANT
GLOVE BIO SURGEON STRL SZ 6 (GLOVE) IMPLANT
GLOVE BIO SURGEON STRL SZ7.5 (GLOVE) ×1 IMPLANT
GLOVE BIOGEL PI IND STRL 6 (GLOVE) IMPLANT
GLOVE BIOGEL PI IND STRL 7.5 (GLOVE) IMPLANT
GLOVE SURG SS PI 7.5 STRL IVOR (GLOVE) IMPLANT
GOWN STRL REUS W/ TWL LRG LVL3 (GOWN DISPOSABLE) IMPLANT
GOWN STRL REUS W/ TWL XL LVL3 (GOWN DISPOSABLE) IMPLANT
GOWN STRL REUS W/TWL LRG LVL3 (GOWN DISPOSABLE) ×2 IMPLANT
GOWN STRL REUS W/TWL XL LVL3 (GOWN DISPOSABLE) ×1
HIBICLENS CHG 4% 4OZ (MISCELLANEOUS) IMPLANT
KIT TURNOVER CYSTO (KITS) ×1 IMPLANT
MANIFOLD NEPTUNE II (INSTRUMENTS) ×1 IMPLANT
PACK CYSTO (CUSTOM PROCEDURE TRAY) ×1 IMPLANT
SLEEVE SCD COMPRESS KNEE MED (STOCKING) ×1 IMPLANT
TUBE CONNECTING 12X1/4 (SUCTIONS) ×1 IMPLANT
WATER STERILE IRR 3000ML UROMA (IV SOLUTION) ×1 IMPLANT

## 2022-07-09 NOTE — Anesthesia Procedure Notes (Signed)
Procedure Name: LMA Insertion Date/Time: 07/09/2022 7:36 AM  Performed by: Ibtisam Benge D, CRNAPre-anesthesia Checklist: Patient identified, Emergency Drugs available, Suction available and Patient being monitored Patient Re-evaluated:Patient Re-evaluated prior to induction Oxygen Delivery Method: Circle system utilized Preoxygenation: Pre-oxygenation with 100% oxygen Induction Type: IV induction Ventilation: Mask ventilation without difficulty LMA: LMA inserted LMA Size: 3.0 Tube type: Oral Number of attempts: 1 Placement Confirmation: positive ETCO2 and breath sounds checked- equal and bilateral Tube secured with: Tape Dental Injury: Teeth and Oropharynx as per pre-operative assessment

## 2022-07-09 NOTE — Op Note (Signed)
Preoperative diagnosis: Bladder neoplasm Postoperative diagnosis: Bladder neoplasm  Procedure: Cystoscopy with bladder biopsy 0.5 to 2 cm, instillation of gemcitabine in PACU  Surgeon: Junious Silk  Anesthesia: General  Indication for procedure: Meagan Osborne is a 77 year old female with history of bladder cancer.  She was found to have 3 small recurrences 2 posteriorly and 1 at the dome.  Findings: On exam under anesthesia the vulva appears normal without lesion.  She had a grade 2-3 rectocele.  On bimanual the bladder and urethra were palpably normal.  On cystoscopy there were 2 posterior and 1 near the dome for a total of 3 small papillary recurrences.  Otherwise there was no stone or foreign body in the bladder and the trigone and ureteral orifice ease appeared normal.  There was clear efflux bilaterally.  Description of procedure: After consent was obtained patient brought to the operating room.  After adequate anesthesia she was placed in lithotomy position and prepped and draped in the usual sterile fashion.  Timeout was performed to current the patient and procedure.  Exam under anesthesia was performed.  Cystoscope was passed per urethra and the bladder carefully inspected.  Bladder inspected with 30 and 70 degree lens.  The cold cup biopsy forceps were then used to biopsy the 2 small posterior recurrences and these were sent together and 1 at the dome.  The areas were fulgurated.  Hemostasis was excellent under low pressure.  The bladder was drained.  She was awakened taken recovery room in stable condition.  Instillation of gemcitabine in PACU: a 23 French Foley was inserted in the PACU and gemcitabine instilled in the bladder.  It was allowed to dwell for 50 minutes and then the bladder was drained.  The Foley was removed.  Complications: None  Blood loss: Minimal  Specimens to pathology: #1 posterior bladder biopsy #2 dome bladder biopsy  Drains: None  Disposition: Patient stable to  PACU

## 2022-07-09 NOTE — Interval H&P Note (Signed)
History and Physical Interval Note:  07/09/2022 7:23 AM  Meagan Osborne  has presented today for surgery, with the diagnosis of BLADDER CANCER.  The various methods of treatment have been discussed with the patient and family. After consideration of risks, benefits and other options for treatment, the patient has consented to  Procedure(s) with comments: CYSTOSCOPY WITH BLADDER BIOPSY POST OP GEMCITABINE (N/A) - 60 MINS as a surgical intervention.  The patient's history has been reviewed, patient examined, no change in status, stable for surgery.  I have reviewed the patient's chart and labs.  She is well. She had a friend that req RC and IC. We (husband and Akai) again disc importance of good surveillance. Disc nature r/b of bcg and we will consider it. Questions were answered to the patient's satisfaction.     Festus Aloe

## 2022-07-09 NOTE — Discharge Instructions (Signed)
°  Post Anesthesia Home Care Instructions ° °Activity: °Get plenty of rest for the remainder of the day. A responsible individual must stay with you for 24 hours following the procedure.  °For the next 24 hours, DO NOT: °-Drive a car °-Operate machinery °-Drink alcoholic beverages °-Take any medication unless instructed by your physician °-Make any legal decisions or sign important papers. ° °Meals: °Start with liquid foods such as gelatin or soup. Progress to regular foods as tolerated. Avoid greasy, spicy, heavy foods. If nausea and/or vomiting occur, drink only clear liquids until the nausea and/or vomiting subsides. Call your physician if vomiting continues. ° °Special Instructions/Symptoms: °Your throat may feel dry or sore from the anesthesia or the breathing tube placed in your throat during surgery. If this causes discomfort, gargle with warm salt water. The discomfort should disappear within 24 hours. °

## 2022-07-09 NOTE — Anesthesia Postprocedure Evaluation (Signed)
Anesthesia Post Note  Patient: RULA VRANICH  Procedure(s) Performed: CYSTOSCOPY WITH BLADDER BIOPSY (0.5-2cm) POST OP GEMCITABINE (Bladder)     Patient location during evaluation: PACU Anesthesia Type: General Level of consciousness: awake and alert Pain management: pain level controlled Vital Signs Assessment: post-procedure vital signs reviewed and stable Respiratory status: spontaneous breathing, nonlabored ventilation, respiratory function stable and patient connected to nasal cannula oxygen Cardiovascular status: blood pressure returned to baseline and stable Postop Assessment: no apparent nausea or vomiting Anesthetic complications: no  No notable events documented.  Last Vitals:  Vitals:   07/09/22 0809 07/09/22 0815  BP: (!) 128/53 (!) 126/59  Pulse: 65 61  Resp: 13 14  Temp: 36.6 C   SpO2: (!) 88% 91%    Last Pain:  Vitals:   07/09/22 0815  TempSrc:   PainSc: (P) 0-No pain                 Wilho Sharpley

## 2022-07-09 NOTE — Transfer of Care (Signed)
Immediate Anesthesia Transfer of Care Note  Patient: Meagan Osborne  Procedure(s) Performed: CYSTOSCOPY WITH BLADDER BIOPSY (0.5-2cm) POST OP GEMCITABINE (Bladder)  Patient Location: PACU  Anesthesia Type:General  Level of Consciousness: awake, alert , and oriented  Airway & Oxygen Therapy: Patient Spontanous Breathing and Patient connected to nasal cannula oxygen  Post-op Assessment: Report given to RN and Post -op Vital signs reviewed and stable  Post vital signs: Reviewed and stable  Last Vitals:  Vitals Value Taken Time  BP 128/53 07/09/22 0809  Temp    Pulse 61 07/09/22 0814  Resp 12 07/09/22 0814  SpO2 90 % 07/09/22 0814  Vitals shown include unvalidated device data.  Last Pain:  Vitals:   07/09/22 0555  TempSrc: Oral  PainSc: 0-No pain      Patients Stated Pain Goal: 5 ([id number])  Complications: No notable events documented.

## 2022-07-12 ENCOUNTER — Encounter (HOSPITAL_BASED_OUTPATIENT_CLINIC_OR_DEPARTMENT_OTHER): Payer: Self-pay | Admitting: Urology

## 2022-07-12 LAB — SURGICAL PATHOLOGY

## 2022-10-27 ENCOUNTER — Ambulatory Visit: Payer: Medicare Other | Admitting: Dermatology

## 2022-11-09 ENCOUNTER — Ambulatory Visit (INDEPENDENT_AMBULATORY_CARE_PROVIDER_SITE_OTHER): Payer: Medicare Other | Admitting: Dermatology

## 2022-11-09 VITALS — BP 157/90

## 2022-11-09 DIAGNOSIS — L309 Dermatitis, unspecified: Secondary | ICD-10-CM

## 2022-11-09 MED ORDER — HYDROXYZINE HCL 10 MG PO TABS: ORAL_TABLET | ORAL | 1 refills | Status: AC

## 2022-11-09 MED ORDER — CLOBETASOL PROPIONATE 0.05 % EX SOLN: CUTANEOUS | 1 refills | Status: DC

## 2022-11-09 MED ORDER — HYDROCORTISONE 2.5 % EX CREA: TOPICAL_CREAM | CUTANEOUS | 1 refills | Status: AC

## 2022-11-09 NOTE — Patient Instructions (Addendum)
Eczema Skin Care  Buy TWO 16oz jars of CeraVe moisturizing cream  [facility], [facility], [facility] (no prescription needed)  Costs about $15 per jar   Jar #1: Use as a moisturizer as needed. Can be applied to any area of the body. Use twice daily to unaffected areas.  Jar #2: Pour one 50ml bottle of clobetasol 0.05% solution into jar, mix well. Label this jar to indicate the medication has been added. Use twice daily to affected areas. Do not apply to face, groin or underarms.  Moisturizer may burn or sting initially. Try for at least 4 weeks.   Start Allegra (fexofenadine) 180 MG take 1 tablet every morning. Start hydroxyzine 10 MG take 1-3 tablets before bed as needed for itch. Start hydrocortisone 2.5% cream - Apply to affected areas rash on face, skin fold areas twice daily until improved.   Due to recent changes in healthcare laws, you may see results of your pathology and/or laboratory studies on MyChart before the doctors have had a chance to review them. We understand that in some cases there may be results that are confusing or concerning to you. Please understand that not all results are received at the same time and often the doctors may need to interpret multiple results in order to provide you with the best plan of care or course of treatment. Therefore, we ask that you please give [place] 2 business days to thoroughly review all your results before contacting the office for clarification. Should we see a critical lab result, you will be contacted sooner.   If You Need Anything After Your Visit  If you have any questions or concerns for your doctor, please call our main line at [phone] and press option 4 to reach your doctor's medical assistant. If no one answers, please leave a voicemail as directed and we will return your call as soon as possible. Messages left after 4 pm will be answered the following business day.   You may also send [place] a message via MyChart. We typically respond to  MyChart messages within 1-2 business days.  For prescription refills, please ask your pharmacy to contact our office. Our fax number is [phone].  If you have an urgent issue when the clinic is closed that cannot wait until the next business day, you can page your doctor at the number below.    Please note that while we do our best to be available for urgent issues outside of office hours, we are not available 24/7.   If you have an urgent issue and are unable to reach [place], you may choose to seek medical care at your doctor's office, retail clinic, urgent care center, or emergency room.  If you have a medical emergency, please immediately call 911 or go to the emergency department.  Pager Numbers  - Dr. Gwen Pounds: [phone]  - Dr. Neale Burly: [phone]  - Dr. Roseanne Reno: [phone]  In the event of inclement weather, please call our main line at [phone] for an update on the status of any delays or closures.  Dermatology Medication Tips: Please keep the boxes that topical medications come in in order to help keep track of the instructions about where and how to use these. Pharmacies typically print the medication instructions only on the boxes and not directly on the medication tubes.   If your medication is too expensive, please contact our office at [phone] option 4 or send [place] a message through MyChart.   We are unable to tell what your  co-pay for medications will be in advance as this is different depending on your insurance coverage. However, we may be able to find a substitute medication at lower cost or fill out paperwork to get insurance to cover a needed medication.   If a prior authorization is required to get your medication covered by your insurance company, please allow [place] 1-2 business days to complete this process.  Drug prices often vary depending on where the prescription is filled and some pharmacies may offer cheaper prices.  The website www.goodrx.com  contains coupons for medications through different pharmacies. The prices here do not account for what the cost may be with help from insurance (it may be cheaper with your insurance), but the website can give you the price if you did not use any insurance.  - You can print the associated coupon and take it with your prescription to the pharmacy.  - You may also stop by our office during regular business hours and pick up a GoodRx coupon card.  - If you need your prescription sent electronically to a different pharmacy, notify our office through [facility] or by phone at [phone] option 4.     Si Usted Necesita Algo Despus de Su Visita  Tambin puede enviarnos un mensaje a travs de Clinical cytogeneticist. Por lo general respondemos a los mensajes de MyChart en el transcurso de 1 a 2 das hbiles.  Para renovar recetas, por favor pida a su farmacia que se ponga en contacto con nuestra oficina. Annie Sable de fax es [place] [phone].  Si tiene un asunto urgente cuando la clnica est cerrada y que no puede esperar hasta el siguiente da hbil, puede llamar/localizar a su doctor(a) al nmero que aparece a continuacin.   Por favor, tenga en cuenta que aunque hacemos todo lo posible para estar disponibles para asuntos urgentes fuera del horario de [place], no estamos disponibles las 24 horas del da, los 7 [address]  [address] de la [place].   Si tiene un problema urgente y no puede comunicarse con nosotros, puede optar por buscar atencin mdica  en el consultorio de su doctor(a), en una clnica privada, en un centro de atencin urgente o en una sala de emergencias.  Si tiene Engineer, drilling, por favor llame inmediatamente al 911 o vaya a la sala de emergencias.  Nmeros de bper  - Dr. Gwen Pounds: [phone]  - Dra. Moye: [phone]  - Dra. Roseanne Reno: [phone]  En caso de inclemencias del [place], por favor llame a Lacy Duverney principal al [phone] para una actualizacin sobre el [place]  de cualquier retraso o cierre.  Consejos para la medicacin en dermatologa: Por favor, guarde las cajas en las que vienen los medicamentos de uso tpico para ayudarle a seguir las instrucciones sobre dnde y cmo usarlos. Las farmacias generalmente imprimen las instrucciones del medicamento slo en las cajas y no directamente en los tubos del [place].   Si su medicamento es muy caro, por favor, pngase en contacto con Rolm Gala llamando al [phone] y presione la opcin 4 o envenos un mensaje a travs de Clinical cytogeneticist.   No podemos decirle cul ser su copago por los medicamentos por adelantado ya que esto es diferente dependiendo de la cobertura de su seguro. Sin embargo, es posible que podamos encontrar un medicamento sustituto a Audiological scientist un formulario para que el seguro cubra el medicamento que se considera necesario.   Si se requiere una autorizacin previa para que su compaa de seguros [place] su medicamento, por  favor permtanos de 1 a 2 das hbiles para completar este proceso.  Los precios de los medicamentos varan con frecuencia dependiendo del Environmental consultant de dnde se surte la receta y alguna farmacias pueden ofrecer precios ms baratos.  El sitio web www.goodrx.com tiene cupones para medicamentos de Airline pilot. Los precios aqu no tienen en cuenta lo que podra costar con la ayuda del seguro (puede ser ms barato con su seguro), pero el sitio web puede darle el precio si no utiliz Research scientist (physical sciences).  - Puede imprimir el cupn correspondiente y llevarlo con su receta a la farmacia.  - Tambin puede pasar por nuestra oficina durante el horario de atencin regular y Charity fundraiser una tarjeta de cupones de GoodRx.  - Si necesita que su receta se enve electrnicamente a una farmacia diferente, informe a nuestra oficina a travs de MyChart de [place] o por telfono llamando al [phone] y presione la opcin 4.

## 2022-11-09 NOTE — Progress Notes (Signed)
   Follow Up Visit   Subjective  Meagan Osborne is a 77 y.o. female who presents for the following: Itchy rash on the trunk and extremities x 6 weeks. She is using an OTC itch lotion several times a day. She is still getting new spots come up. 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 She has had psoriasis of the skin in the past. No new medicines, lotions, soap, shampoo. No history of eczema. She uses sensitive skin Dove soap and Olay moisturizer. She has 2 indoor dogs. No one else in house itchy. Patient with seasonal allergies. No recent travel prior to rash.   History of bladder cancer (5 surgeries over 6 years) with chemo flush.    The following portions of the chart were reviewed this encounter and updated as appropriate: medications, allergies, medical history  Review of Systems:  No other skin or systemic complaints except as noted in HPI or Assessment and Plan.  Objective  Well appearing patient in no apparent distress; mood and affect are within normal limits.  A focused examination was performed of the following areas: Face, trunk, extremities  Relevant exam findings are noted in the Assessment and Plan.    Assessment & Plan     DERMATITIS- unclear etiology, possible atopic Exam: Pink excoriated papules on the arms, chest, back.   Treatment Plan: Start Clobetasol solution/CeraVe mix - Pour one 50ml bottle of clobetasol 0.05% solution into CereVe jar, mix well. Label this jar to indicate the medication has been added. Use twice daily to affected areas. Do not apply to face, groin or underarms. 1Rf  Start hydrocortisone 2.5% cream Apply BID to AA rash on face, skin folds dsp 30g 1Rf.  Start Allegra 180 MG take 1 po QAM.  Start hydroxyzine 10 MG take 1-3 tabs po at bedtime as needed for itch dsp #90 1Rf.  Return 4-6 weeks, for f/u rash.  ICherlyn Labella, CMA, am acting as scribe for Willeen Niece, MD .   Documentation: I have reviewed the above documentation for  accuracy and completeness, and I agree with the above.  Willeen Niece, MD

## 2022-11-22 ENCOUNTER — Other Ambulatory Visit: Payer: Self-pay | Admitting: Dermatology

## 2022-12-21 ENCOUNTER — Ambulatory Visit (INDEPENDENT_AMBULATORY_CARE_PROVIDER_SITE_OTHER): Payer: Medicare Other | Admitting: Dermatology

## 2022-12-21 VITALS — BP 134/69

## 2022-12-21 DIAGNOSIS — Z7189 Other specified counseling: Secondary | ICD-10-CM

## 2022-12-21 DIAGNOSIS — L400 Psoriasis vulgaris: Secondary | ICD-10-CM

## 2022-12-21 DIAGNOSIS — L404 Guttate psoriasis: Secondary | ICD-10-CM

## 2022-12-21 DIAGNOSIS — L409 Psoriasis, unspecified: Secondary | ICD-10-CM

## 2022-12-21 MED ORDER — CLOBETASOL PROPIONATE 0.05 % EX SOLN: 1.0000 | Freq: Two times a day (BID) | CUTANEOUS | 2 refills | Status: AC

## 2022-12-21 NOTE — Progress Notes (Signed)
   Follow-Up Visit   Subjective  Meagan Osborne is a 77 y.o. female who presents for the following: Itchy rash 6 wk f/u, arms, chest, back, legs, Clobetasol/cerave  bid, HC 2.5%  cr qd under breast, Hydroxyzine 10mg  prn, no hx of chron's, no hx of ulcerative colitis, pt has h/o scalp psoriasis and arthritis. Rash is better on arms, but not much better on legs.  Very itchy.  The patient has spots, moles and lesions to be evaluated, some may be new or changing and the patient may have concern these could be cancer.   The following portions of the chart were reviewed this encounter and updated as appropriate: medications, allergies, medical history  Review of Systems:  No other skin or systemic complaints except as noted in HPI or Assessment and Plan.  Objective  Well appearing patient in no apparent distress; mood and affect are within normal limits.   A focused examination was performed of the following areas: Back, chest, arms,   Relevant exam findings are noted in the Assessment and Plan.    Assessment & Plan   GUTTATE PSORIASIS / PLAQUE PSORIASIS Arms, legs, trunk, inframammary, buttocks, scalp Exam: scattered bright pink papules legs, elbows, gluteal cleft, upper back, pink scaly patch occipital scalp 10% BSA Hx of joint pain hands, knees  Chronic and persistent condition with duration or expected duration over one year. Condition is symptomatic/ bothersome to patient. Not currently at goal.   Counseling on psoriasis and coordination of care  psoriasis is a chronic non-curable, but treatable genetic/hereditary disease that may have other systemic features affecting other organ systems such as joints (Psoriatic Arthritis). It is associated with an increased risk of inflammatory bowel disease, heart disease, non-alcoholic fatty liver disease, and depression.  Treatments include light and laser treatments; topical medications; and systemic medications including oral and injectables.    Treatment Plan: Pending labs Start Cosentyx sq injections, (advised pt we can start her on samples while waiting on insurance approval) Cont Clobetasol/cerave cr qd/bid aa trunk, arms, legs until clear, then prn flares, avoid f/g/a Cont HC 2.5% cr inframammary qd prn flares Cont Hydroxyzine 10mg  1 -3 po hs prn itch, may make drowsy    Topical steroids (such as triamcinolone, fluocinolone, fluocinonide, mometasone, clobetasol, halobetasol, betamethasone, hydrocortisone) can cause thinning and lightening of the skin if they are used for too long in the same area. Your physician has selected the right strength medicine for your problem and area affected on the body. Please use your medication only as directed by your physician to prevent side effects.    Reviewed risks of biologics including immunosuppression, infections, injection site reaction, and failure to improve condition. Goal is control of skin condition, not cure.  Some older biologics such as Humira and Enbrel may slightly increase risk of malignancy and may worsen congestive heart failure.  Taltz and Cosentyx may cause inflammatory bowel disease to flare. The use of biologics requires long term medication management, including periodic office visits and monitoring of blood work.    Return in about 1 month (around 01/21/2023) for Psoriasis f/u.  I, Ardis Rowan, RMA, am acting as scribe for Willeen Niece, MD .   Documentation: I have reviewed the above documentation for accuracy and completeness, and I agree with the above.  Willeen Niece, MD

## 2022-12-21 NOTE — Patient Instructions (Signed)

## 2022-12-26 LAB — COMPREHENSIVE METABOLIC PANEL WITH GFR
ALT: 27 IU/L (ref 0–32)
AST: 28 IU/L (ref 0–40)
Albumin: 4.3 g/dL (ref 3.8–4.8)
Alkaline Phosphatase: 81 IU/L (ref 44–121)
BUN/Creatinine Ratio: 15 (ref 12–28)
BUN: 11 mg/dL (ref 8–27)
Bilirubin Total: 0.4 mg/dL (ref 0.0–1.2)
CO2: 24 mmol/L (ref 20–29)
Calcium: 9.3 mg/dL (ref 8.7–10.3)
Chloride: 105 mmol/L (ref 96–106)
Creatinine, Ser: 0.72 mg/dL (ref 0.57–1.00)
Globulin, Total: 2.2 g/dL (ref 1.5–4.5)
Glucose: 153 mg/dL — ABNORMAL HIGH (ref 70–99)
Potassium: 4.4 mmol/L (ref 3.5–5.2)
Sodium: 144 mmol/L (ref 134–144)
Total Protein: 6.5 g/dL (ref 6.0–8.5)
eGFR: 86 mL/min/1.73 (ref >59)

## 2022-12-26 LAB — HEPATITIS B SURFACE ANTIBODY,QUALITATIVE: Hep B Surface Ab, Qual: NONREACTIVE

## 2022-12-26 LAB — HEPATITIS B SURFACE ANTIGEN: Hepatitis B Surface Ag: NEGATIVE

## 2022-12-27 ENCOUNTER — Telehealth: Payer: Self-pay

## 2022-12-27 DIAGNOSIS — L409 Psoriasis, unspecified: Secondary | ICD-10-CM

## 2022-12-27 MED ORDER — COSENTYX SENSOREADY (300 MG) 150 MG/ML ~~LOC~~ SOAJ: 300.0000 mg | SUBCUTANEOUS | 6 refills | Status: DC

## 2022-12-27 MED ORDER — COSENTYX SENSOREADY (300 MG) 150 MG/ML ~~LOC~~ SOAJ: 300.0000 mg | SUBCUTANEOUS | 0 refills | Status: DC

## 2022-12-27 NOTE — Telephone Encounter (Signed)
-----   Message from [place] sent at 12/27/2022 12:18 PM EDT ----- Please call to convey: Labs are normal (blood counts, liver enzymes, electrolytes, kidney function, hepatitis serologies, TB test) except elevated glucose from diabetes and slightly high eosinophil count (not clinically significant). Can start Cosentyx for psoriasis if patient still desires.

## 2022-12-27 NOTE — Telephone Encounter (Signed)
Advised pt of labs.  Cosentyx sent to [facility].  Scheduled pt for nurse visit to start sample Cosentyx./sh

## 2022-12-28 MED ORDER — SECUKINUMAB 300 MG/2ML ~~LOC~~ SOAJ
300.0000 mg | SUBCUTANEOUS | Status: AC
Start: 2022-12-28 — End: 2023-02-01
  Administered 2023-01-06 – 2023-01-13 (×2): 300 mg via SUBCUTANEOUS

## 2022-12-28 NOTE — Telephone Encounter (Signed)
Added orders for Cosentyx. aw

## 2022-12-28 NOTE — Addendum Note (Signed)
Addended by: Dorathy Daft R on: 12/28/2022 02:31 PM   Modules accepted: Orders

## 2023-01-04 ENCOUNTER — Other Ambulatory Visit: Payer: Self-pay

## 2023-01-04 MED ORDER — COSENTYX SENSOREADY (300 MG) 150 MG/ML ~~LOC~~ SOAJ: 300.0000 mg | SUBCUTANEOUS | 0 refills | Status: DC

## 2023-01-04 MED ORDER — COSENTYX SENSOREADY (300 MG) 150 MG/ML ~~LOC~~ SOAJ: 300.0000 mg | SUBCUTANEOUS | 6 refills | Status: AC

## 2023-01-04 NOTE — Progress Notes (Signed)
Fax received from [facility] requesting pharmacy change from the to [facility]. Escripted

## 2023-01-06 ENCOUNTER — Ambulatory Visit (INDEPENDENT_AMBULATORY_CARE_PROVIDER_SITE_OTHER): Payer: Medicare Other

## 2023-01-06 DIAGNOSIS — L409 Psoriasis, unspecified: Secondary | ICD-10-CM | POA: Diagnosis not present

## 2023-01-06 NOTE — Progress Notes (Signed)
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   Cosentyx Unoready, 300mg /60mL injected into left thigh. Patient tolerated okay.   LOT: [id number] EXP: 07/07/2024  Patient provided with [employer] Patient Assistance paperwork to take home and complete and bring back for nurse visit next week. All questions answered regarding [facility]. aw

## 2023-01-13 ENCOUNTER — Ambulatory Visit (INDEPENDENT_AMBULATORY_CARE_PROVIDER_SITE_OTHER): Payer: Medicare Other

## 2023-01-13 DIAGNOSIS — L400 Psoriasis vulgaris: Secondary | ICD-10-CM | POA: Diagnosis not present

## 2023-01-13 NOTE — Progress Notes (Signed)
Patient here today for second week of loading doses of Cosentyx for Psoraisis Vulgaris.    Cosentyx Unoready, 300mg /21mL injected into right thigh. Patient tolerated well.    LOT: [id number] EXP: 12/09/2023  Dorathy Daft, RMA

## 2023-02-02 ENCOUNTER — Encounter: Payer: Medicare Other | Admitting: Dermatology

## 2023-03-02 ENCOUNTER — Ambulatory Visit (INDEPENDENT_AMBULATORY_CARE_PROVIDER_SITE_OTHER): Payer: Medicare Other | Admitting: Dermatology

## 2023-03-02 DIAGNOSIS — L409 Psoriasis, unspecified: Secondary | ICD-10-CM

## 2023-03-02 DIAGNOSIS — Z79899 Other long term (current) drug therapy: Secondary | ICD-10-CM | POA: Diagnosis not present

## 2023-03-02 DIAGNOSIS — Z7189 Other specified counseling: Secondary | ICD-10-CM

## 2023-03-02 NOTE — Progress Notes (Signed)
   Follow-Up Visit   Subjective  Meagan Osborne is a 77 y.o. female who presents for the following: Psoriasis of the trunk, extremities, scalp. She is on Cosentyx injections, started 01/06/23, tolerating ok and no side effects. She is still breaking out some, but much improved since starting injections. Not itching as much. She is using clobetasol/CeraVe mix and hydrocortisone 2.5% cream.   Patient accompanied by daughter.   The following portions of the chart were reviewed this encounter and updated as appropriate: medications, allergies, medical history  Review of Systems:  No other skin or systemic complaints except as noted in HPI or Assessment and Plan.  Objective  Well appearing patient in no apparent distress; mood and affect are within normal limits.  Areas Examined: Face, scalp, arms  Relevant exam findings are noted in the Assessment and Plan.      Assessment & Plan     PSORIASIS on systemic treatment Scaly pink/flesh papule on the right temple, left antecubitum <1% BSA., scalp clear  Chronic and persistent condition with duration or expected duration over one year. Condition is improving with treatment but not currently at goal.   Counseling and coordination of care for severe psoriasis on systemic treatment  Psoriasis - severe on systemic treatment.  Psoriasis is a chronic non-curable, but treatable genetic/hereditary disease that may have other systemic features affecting other organ systems such as joints (Psoriatic Arthritis).  It is linked with heart disease, inflammatory bowel disease, non-alcoholic fatty liver disease, and depression. Significant skin psoriasis and/or psoriatic arthritis may have significant symptoms and affects activities of daily activity and often benefits from systemic treatments.  These systemic treatments have some potential side effects including immunosuppression and require pre-treatment laboratory screening and periodic laboratory  monitoring and periodic in person evaluation and monitoring by the attending dermatologist physician (long term medication management).   Decreased joint pain hands, knees.   Treatment Plan: Continue Cosentyx injections 300 mg/mL sq q 28 days. [employer] Patient Assistance.  Cont Clobetasol/cerave cr qd/bid aa trunk, arms, legs until clear, then prn flares, avoid f/g/a Cont HC 2.5% cr inframammary qd prn flares. Cont Hydroxyzine 10mg  1 -3 po hs prn itch, may make drowsy.  Reviewed risks of biologics including immunosuppression, infections, injection site reaction, and failure to improve condition. Goal is control of skin condition, not cure.  Some older biologics such as Humira and Enbrel may slightly increase risk of malignancy and may worsen congestive heart failure.  Taltz and Cosentyx may cause inflammatory bowel disease to flare. The use of biologics requires long term medication management, including periodic office visits and monitoring of blood work.   Long term medication management.  Patient is using long term (months to years) prescription medication  to control their dermatologic condition.  These medications require periodic monitoring to evaluate for efficacy and side effects and may require periodic laboratory monitoring.   Return in about 6 months (around 08/31/2023) for Psoriasis.  ICherlyn Labella, CMA, am acting as scribe for Willeen Niece, MD .   Documentation: I have reviewed the above documentation for accuracy and completeness, and I agree with the above.  Willeen Niece, MD

## 2023-03-02 NOTE — Patient Instructions (Signed)
Reviewed risks of biologics including immunosuppression, infections, injection site reaction, and failure to improve condition. Goal is control of skin condition, not cure.  Some older biologics such as Humira and Enbrel may slightly increase risk of malignancy and may worsen congestive heart failure.  Taltz and Cosentyx may cause inflammatory bowel disease to flare. The use of biologics requires long term medication management, including periodic office visits and monitoring of blood work.  Topical steroids (such as triamcinolone, fluocinolone, fluocinonide, mometasone, clobetasol, halobetasol, betamethasone, hydrocortisone) can cause thinning and lightening of the skin if they are used for too long in the same area. Your physician has selected the right strength medicine for your problem and area affected on the body. Please use your medication only as directed by your physician to prevent side effects.   Due to recent changes in healthcare laws, you may see results of your pathology and/or laboratory studies on MyChart before the doctors have had a chance to review them. We understand that in some cases there may be results that are confusing or concerning to you. Please understand that not all results are received at the same time and often the doctors may need to interpret multiple results in order to provide you with the best plan of care or course of treatment. Therefore, we ask that you please give [place] 2 business days to thoroughly review all your results before contacting the office for clarification. Should we see a critical lab result, you will be contacted sooner.   If You Need Anything After Your Visit  If you have any questions or concerns for your doctor, please call our main line at [phone] and press option 4 to reach your doctor's medical assistant. If no one answers, please leave a voicemail as directed and we will return your call as soon as possible. Messages left after 4 pm will be  answered the following business day.   You may also send [place] a message via MyChart. We typically respond to MyChart messages within 1-2 business days.  For prescription refills, please ask your pharmacy to contact our office. Our fax number is [phone].  If you have an urgent issue when the clinic is closed that cannot wait until the next business day, you can page your doctor at the number below.    Please note that while we do our best to be available for urgent issues outside of office hours, we are not available 24/7.   If you have an urgent issue and are unable to reach [place], you may choose to seek medical care at your doctor's office, retail clinic, urgent care center, or emergency room.  If you have a medical emergency, please immediately call 911 or go to the emergency department.  Pager Numbers  - Dr. Gwen Pounds: [phone]  - Dr. Roseanne Reno: [phone]  - Dr. Katrinka Blazing: [phone]   In the event of inclement weather, please call our main line at [phone] for an update on the status of any delays or closures.  Dermatology Medication Tips: Please keep the boxes that topical medications come in in order to help keep track of the instructions about where and how to use these. Pharmacies typically print the medication instructions only on the boxes and not directly on the medication tubes.   If your medication is too expensive, please contact our office at [phone] option 4 or send [place] a message through MyChart.   We are unable to tell what your co-pay for medications will be in advance as this  is different depending on your insurance coverage. However, we may be able to find a substitute medication at lower cost or fill out paperwork to get insurance to cover a needed medication.   If a prior authorization is required to get your medication covered by your insurance company, please allow [place] 1-2 business days to complete this process.  Drug prices often vary depending on  where the prescription is filled and some pharmacies may offer cheaper prices.  The website www.goodrx.com contains coupons for medications through different pharmacies. The prices here do not account for what the cost may be with help from insurance (it may be cheaper with your insurance), but the website can give you the price if you did not use any insurance.  - You can print the associated coupon and take it with your prescription to the pharmacy.  - You may also stop by our office during regular business hours and pick up a GoodRx coupon card.  - If you need your prescription sent electronically to a different pharmacy, notify our office through [facility] or by phone at [phone] option 4.     Si Usted Necesita Algo Despus de Su Visita  Tambin puede enviarnos un mensaje a travs de Clinical cytogeneticist. Por lo general respondemos a los mensajes de MyChart en el transcurso de 1 a 2 das hbiles.  Para renovar recetas, por favor pida a su farmacia que se ponga en contacto con nuestra oficina. Annie Sable de fax es [place] [phone].  Si tiene un asunto urgente cuando la clnica est cerrada y que no puede esperar hasta el siguiente da hbil, puede llamar/localizar a su doctor(a) al nmero que aparece a continuacin.   Por favor, tenga en cuenta que aunque hacemos todo lo posible para estar disponibles para asuntos urgentes fuera del horario de [place], no estamos disponibles las 24 horas del da, los 7 [address]  [address] de la [place].   Si tiene un problema urgente y no puede comunicarse con nosotros, puede optar por buscar atencin mdica  en el consultorio de su doctor(a), en una clnica privada, en un centro de atencin urgente o en una sala de emergencias.  Si tiene Engineer, drilling, por favor llame inmediatamente al 911 o vaya a la sala de emergencias.  Nmeros de bper  - Dr. Gwen Pounds: [phone]  - Dra. Roseanne Reno: [phone]  - Dr. Katrinka Blazing: [phone]   En caso de inclemencias  del tiempo, por favor llame a Lacy Duverney principal al [phone] para una actualizacin sobre el [place] de cualquier retraso o cierre.  Consejos para la medicacin en dermatologa: Por favor, guarde las cajas en las que vienen los medicamentos de uso tpico para ayudarle a seguir las instrucciones sobre dnde y cmo usarlos. Las farmacias generalmente imprimen las instrucciones del medicamento slo en las cajas y no directamente en los tubos del [place].   Si su medicamento es muy caro, por favor, pngase en contacto con Rolm [place]ala llamando al [phone] y presione la opcin 4 o envenos un mensaje a travs de Clinical cytogeneticist.   No podemos decirle cul ser su copago por los medicamentos por adelantado ya que esto es diferente dependiendo de la cobertura de su seguro. Sin embargo, es posible que podamos encontrar un medicamento sustituto a Audiological scientist un formulario para que el seguro cubra el medicamento que se considera necesario.   Si se requiere una autorizacin previa para que su compaa de seguros [place] su medicamento, por favor permtanos de 1 a 2 das hbiles  para Production designer, theatre/television/film.  Los precios de los medicamentos varan con frecuencia dependiendo del Environmental consultant de dnde se surte la receta y alguna farmacias pueden ofrecer precios ms baratos.  El sitio web www.goodrx.com tiene cupones para medicamentos de Health and safety inspector. Los precios aqu no tienen en cuenta lo que podra costar con la ayuda del seguro (puede ser ms barato con su seguro), pero el sitio web puede darle el precio si no utiliz Tourist information centre manager.  - Puede imprimir el cupn correspondiente y llevarlo con su receta a la farmacia.  - Tambin puede pasar por nuestra oficina durante el horario de atencin regular y Education officer, museum una tarjeta de cupones de GoodRx.  - Si necesita que su receta se enve electrnicamente a una farmacia diferente, informe a nuestra oficina a travs de MyChart de [place] o por telfono  llamando al [phone] y presione la opcin 4.

## 2023-06-23 DIAGNOSIS — J0110 Acute frontal sinusitis, unspecified: Secondary | ICD-10-CM | POA: Diagnosis not present

## 2023-08-08 DIAGNOSIS — H26493 Other secondary cataract, bilateral: Secondary | ICD-10-CM | POA: Diagnosis not present

## 2023-08-08 DIAGNOSIS — Z0100 Encounter for examination of eyes and vision without abnormal findings: Secondary | ICD-10-CM | POA: Diagnosis not present

## 2023-08-08 DIAGNOSIS — E119 Type 2 diabetes mellitus without complications: Secondary | ICD-10-CM | POA: Diagnosis not present

## 2023-08-17 DIAGNOSIS — C671 Malignant neoplasm of dome of bladder: Secondary | ICD-10-CM | POA: Diagnosis not present

## 2023-08-17 DIAGNOSIS — E782 Mixed hyperlipidemia: Secondary | ICD-10-CM | POA: Diagnosis not present

## 2023-08-17 DIAGNOSIS — E1169 Type 2 diabetes mellitus with other specified complication: Secondary | ICD-10-CM | POA: Diagnosis not present

## 2023-08-17 DIAGNOSIS — E785 Hyperlipidemia, unspecified: Secondary | ICD-10-CM | POA: Diagnosis not present

## 2023-08-18 ENCOUNTER — Other Ambulatory Visit: Payer: Self-pay | Admitting: Urology

## 2023-08-24 DIAGNOSIS — E119 Type 2 diabetes mellitus without complications: Secondary | ICD-10-CM | POA: Diagnosis not present

## 2023-08-24 DIAGNOSIS — E782 Mixed hyperlipidemia: Secondary | ICD-10-CM | POA: Diagnosis not present

## 2023-08-24 DIAGNOSIS — I10 Essential (primary) hypertension: Secondary | ICD-10-CM | POA: Diagnosis not present

## 2023-08-24 DIAGNOSIS — Z87898 Personal history of other specified conditions: Secondary | ICD-10-CM | POA: Diagnosis not present

## 2023-08-31 NOTE — H&P (Signed)
 Office Visit Report     08/17/2023   --------------------------------------------------------------------------------   Meagan Osborne  MRN: [id number]  DOB: 1945-07-01, 78 year old Female  SSN: [id number]   PRIMARY CARE:  Monique Ano, MD  PRIMARY CARE FAX:  [phone]  REFERRING:  Clarinda Crome, PA-C  PROVIDER:  Christina Coyer, M.D.  LOCATION:  [facility] [phone]     --------------------------------------------------------------------------------   CC/HPI: F/u -   1) H/o bladder cancer - She can't take estrogens because of family history.   Biopsy:  Sept 2015 - high-grade TA (majority low-grade, focal high-grade) left bladder s/p TURBT with MMC. Nl EUA. Presented with microscopic hematuria and irritative symptoms.  Jan 2017 - small LG Ta right anterior  Jun 2022 - small LG Ta with gemcitabine  - right BN and right dome  Mar 2023 LG Ta posterior with gemcitabine   Mar 2024 - LG Ta post and dome - cysto, bbx + gemcitabine .   Staging/upper tract:  Sept 2015 CT hematuria  Dec 2016 CT hematuria  Jun 2021 renal US   06/22 bilateral RGP   Last cytology: Mar 2016 - negative   Today, seen for the above. No gross hematuria     ALLERGIES: Amoxicillin TABS Betadine Biaxin TABS losartan Shellfish-derived Products  Sulfa Drugs    MEDICATIONS: Amlodipine  Besylate  Aspirin  81 MG Tablet Delayed Release Oral  Atorvastatin  Calcium  40 MG Oral Tablet Oral  Benazepril  HCl 40 MG Tablet Oral  Carvedilol  25 MG Tablet Oral  Cosentyx  1 PO Daily  Flonase 50 MCG/ACT Nasal Suspension Nasal  Lexapro   Multi-Vitamin Tablet Oral  NexIUM 40 MG Oral Capsule Delayed Release Oral  Norvasc  10 MG Tablet     GU PSH: Bladder Instill AntiCA Agent - 2022, 2015 Cysto Fulgurate < 0.5 cm - 2017 Cystoscopy - 04/20/2023, 10/18/2022, 06/16/2022, 12/09/2021, 2023, 2022, 2021, 2020, 2019, 2018, 2018, 2017 Cystoscopy TURBT <2 cm - 2023, 2022 Cystoscopy TURBT 2-5 cm -  2015 Hysterectomy Unilat SO - 2015       PSH Notes: Cystoscopy With Fulguration Minor Lesion (Under 5mm), Rotator Cuff Repair, Bladder Injection Of Cancer Treatment, Cystoscopy With Fulguration Medium Lesion (2-5cm), Cath Stent Placement, Cholecystectomy, Hysterectomy, Heart Surgery, Back Surgery   NON-GU PSH: Cholecystectomy (open) - 2015 Visit Complexity (formerly GPC1X) - 10/18/2022, 06/16/2022     GU PMH: History of bladder cancer, slight erythema at dome - recheck in 4 mo - 04/20/2023, cysto clear today. Disc bcg and will consider in future. these were very small recurrences. , - 10/18/2022, I used a new HD scope today and got a very good look at her bladder. Looks good., - 12/09/2021, - 2019 Bladder Cancer Posterior, We went over the nature risk benefits and alternatives to cystoscopy with bladder biopsy and postoperative gemcitabine . She will proceed. We should consider a course of BCG. - 06/16/2022 Bladder Cancer Lateral, 3 small recurrences posterior bladder. Discussed the nature risk benefits and alternatives to cystoscopy bladder biopsy fulguration, postop EPIRUBICIN and she elects to proceed. - 2023, Disc stage, grade and prognosis. Check cystoscopy in 6 months. , - 2022, - 2018, - 2018, - 2017, Malignant neoplasm of lateral wall of urinary bladder, - 2017 Bladder, Neoplasm of Unspecified behavior, We discussed the nature r/b/a to cysto, TURBT, bilateral RGP, post-op gemcitabine . All questions answered and she elects to proceed. - 2022, Bladder neoplasm, - 2015 Gross hematuria, set up renal US  - 2021 Dysuria - 2019 Urinary Urgency, Urinary urgency - 2017 Urinary Frequency, Increased urinary frequency -  2016 Other microscopic hematuria, Microhematuria - 2015      PMH Notes: compression fracture-thoracic-March 2021   NON-GU PMH: Encounter for general adult medical examination without abnormal findings, Encounter for preventive health examination - 2015 Anxiety, Anxiety - 2015 Personal  history of other diseases of the circulatory system, History of cardiac disorder - 2015, History of hypertension, - 2015 Personal history of other diseases of the digestive system, History of esophageal reflux - 2015 Personal history of other diseases of the musculoskeletal system and connective tissue, History of arthritis - 2015 Personal history of other endocrine, nutritional and metabolic disease, History of hypercholesterolemia - 2015    FAMILY HISTORY: Dementia - Runs In Family lung disease - Runs In Family malignant neoplasm of urinary bladder - Runs In Family throat cancer - Runs In Family   SOCIAL HISTORY: Marital Status: Married Preferred Language: English; Race: White Current Smoking Status: Patient does not smoke anymore.  Drinks 2 caffeinated drinks per day.     Notes: Former smoker, Married, Retired, Three children, No alcohol use, Daily caffeine consumption, 2-3 servings a day   REVIEW OF SYSTEMS:    GU Review Female:   Patient denies frequent urination, hard to postpone urination, burning /pain with urination, get up at night to urinate, leakage of urine, stream starts and stops, trouble starting your stream, have to strain to urinate, and being pregnant.  Gastrointestinal (Upper):   Patient denies nausea, vomiting, and indigestion/ heartburn.  Gastrointestinal (Lower):   Patient denies diarrhea and constipation.  Constitutional:   Patient denies fever, night sweats, weight loss, and fatigue.  Skin:   Patient denies skin rash/ lesion and itching.  Eyes:   Patient denies blurred vision and double vision.  Ears/ Nose/ Throat:   Patient denies sore throat and sinus problems.  Hematologic/Lymphatic:   Patient denies swollen glands and easy bruising.  Cardiovascular:   Patient denies leg swelling and chest pains.  Respiratory:   Patient denies cough and shortness of breath.  Endocrine:   Patient denies excessive thirst.  Musculoskeletal:   Patient denies back pain and joint  pain.  Neurological:   Patient denies headaches and dizziness.  Psychologic:   Patient denies depression and anxiety.   VITAL SIGNS: None   GU PHYSICAL EXAMINATION:    External Genitalia: No hirsutism, no rash, no scarring, no cyst, no erythematous lesion, no papular lesion, no blanched lesion, no warty lesion. No edema.  Urethral Meatus: Normal size. Normal position. No discharge.  Urethra: No tenderness, no mass, no scarring. No hypermobility. No leakage.  Bladder: Normal to palpation, no tenderness, no mass, normal size.  Vagina: Moderate vaginal atrophy. Moderate rectocele. No stenosis. No cystocele. No enterocele.    MULTI-SYSTEM PHYSICAL EXAMINATION:    Constitutional: Well-nourished. No physical deformities. Normally developed. Good grooming.  Neck: Neck symmetrical, not swollen. Normal tracheal position.  Respiratory: No labored breathing, no use of accessory muscles.   Cardiovascular: Normal temperature, normal extremity pulses, no swelling, no varicosities.  Neurologic / Psychiatric: Oriented to time, oriented to place, oriented to person. No depression, no anxiety, no agitation.  Gastrointestinal: No mass, no tenderness, no rigidity, non obese abdomen.     PAST DATA REVIEW: None   PROCEDURES:         Flexible Cystoscopy - 52000  Risks, benefits, and some of the potential complications of the procedure were discussed at length with the patient including infection, bleeding, voiding discomfort, urinary retention, fever, chills, sepsis, and others. All questions were answered. Informed consent was  obtained. Antibiotic prophylaxis was given. Sterile technique and intraurethral analgesia were used. Chaperone - lauren - for exam and cystoscopy.   Meatus:  Normal size. Normal location. Normal condition.  Urethra:  No hypermobility. No leakage.  Ureteral Orifices:  Normal location. Normal size. Normal shape. Effluxed clear urine.  Bladder:  A small dome tumor. No trabeculation.  Normal mucosa. No stones.      The lower urinary tract was carefully examined. The procedure was well-tolerated and without complications. Antibiotic instructions were given. Instructions were given to call the office immediately for bloody urine, difficulty urinating, urinary retention, painful or frequent urination, fever, chills, nausea, vomiting or other illness. The patient stated that she understood these instructions and would comply with them.         Pelvic Examination - [phone] chaperone: lauren         Urinalysis Dipstick Dipstick Cont'd  Color: Yellow Bilirubin: Neg mg/dL  Appearance: Clear Ketones: Neg mg/dL  Specific Gravity: 4.696 Blood: Neg ery/uL  pH: <=5.0 Protein: Neg mg/dL  Glucose: Neg mg/dL Urobilinogen: 0.2 mg/dL    Nitrites: Neg    Leukocyte Esterase: Neg leu/uL    ASSESSMENT:      ICD-10 Details  1 GU:   Bladder Cancer Dome - C67.1 Undiagnosed New Problem - She has a small recurrence at the dome. Discussed the nature r/b/a to cysto, bbx and she will proceed. Also disc gemcitabine .    PLAN:           Schedule Return Visit/Planned Activity: Next Available Appointment - Schedule Surgery          Document Letter(s):  Created for Patient: Clinical Summary         Notes:   cc: Dr. Jerone Moorman     * Signed by Christina Coyer, M.D. on 08/18/23 at 12:02 PM (EDT)*      The information contained in this medical record document is considered private and confidential patient information. This information can only be used for the medical diagnosis and/or medical services that are being provided by the patient's selected caregivers. This information can only be distributed outside of the patient's care if the patient agrees and signs waivers of authorization for this information to be sent to an outside source or route.

## 2023-09-01 NOTE — Progress Notes (Signed)
 COVID Vaccine received:  []  No [x]  Yes Date of any COVID positive Test in last 90 days:  PCP - Monique Ano, MD  at [facility]  [phone]     fax: [phone]  Cardiologist - Toula French, MD at [facility]  [phone] (Work) [phone] (Fax)   Eddye Goodie, MD  Chest x-ray - 12-24-2011  2v  Epic EKG - 07-2022 Epic   Will repeat  Stress Test -  ECHO -  Cardiac Cath - multiple done at [facility], last 07-30-2014  has DES x 2  Bowel Prep - [x]  No  []   Yes ______  Pacemaker / ICD device [x]  No []  Yes   Spinal Cord Stimulator:[x]  No []  Yes       History of Sleep Apnea? [x]  No []  Yes   CPAP used?- [x]  No []  Yes    Does the patient monitor blood sugar?   []  N/A   []  No []  Yes  Patient has: []  NO Hx DM   []  Pre-DM   []  DM1  [x]   DM2 Last A1c was: 7.1   on   08-17-2023   Does patient have a [employer] or Dexcom? []  No []  Yes   Fasting Blood Sugar Ranges-  Checks Blood Sugar _____ times a day Has METFORMIN but won't start it until after surgery  Blood Thinner / Instructions:None Aspirin  Instructions:   ASA 81 mg  ERAS Protocol Ordered: [x]  No  []  Yes Patient is to be NPO after: Midnight   Dental hx: []  Dentures:  []  N/A      []  Bridge or Partial:                   []  Loose or Damaged teeth:   Comments:   Activity level: Patient is able / unable to climb a flight of stairs without difficulty; []  No CP  []  No SOB, but would have ___   Patient can / can not perform ADLs without assistance.   Anesthesia review: CAD- DES x 2 (caths done at [facility]) Has a New pt appt with Dr. Alvenia Aus 11-15-23. Anxiety, HTN, Fatty Liver, psoriasis,   DIFFICULT INTUBATION with carotid endarterectomy. PONV  Patient denies shortness of breath, fever, cough and chest pain at PAT appointment.  Patient verbalized understanding and agreement to the Pre-Surgical Instructions that were given to them at this PAT appointment. Patient was also educated of the need to review these PAT  instructions again prior to her surgery.I reviewed the appropriate phone numbers to call if they have any and questions or concerns.

## 2023-09-01 NOTE — Patient Instructions (Signed)
 SURGICAL WAITING ROOM VISITATION Patients having surgery or a procedure may have no more than 2 support people in the waiting area - these visitors may rotate in the visitor waiting room.   If the patient needs to stay at the hospital during part of their recovery, the visitor guidelines for inpatient rooms apply.  PRE-OP VISITATION  Pre-op nurse will coordinate an appropriate time for 1 support person to accompany the patient in pre-op.  This support person may not rotate.  This visitor will be contacted when the time is appropriate for the visitor to come back in the pre-op area.  Please refer to the [facility] website for the visitor guidelines for Inpatients (after your surgery is over and you are in a regular room).  You are not required to quarantine at this time prior to your surgery. However, you must do this: Hand Hygiene often Do NOT share personal items Notify your provider if you are in close contact with someone who has COVID or you develop fever 100.4 or greater, new onset of sneezing, cough, sore throat, shortness of breath or body aches.  If you test positive for Covid or have been in contact with anyone that has tested positive in the last 10 days please notify you surgeon.    Your procedure is scheduled on:  TUESDAY   September 06, 2023  Report to [facility] Main Entrance: [address] entrance where the [employer] is available.   Report to admitting at:06:45 AM  Call this number if you have any questions or problems the morning of surgery [phone]  DO NOT EAT OR DRINK ANYTHING AFTER MIDNIGHT THE NIGHT PRIOR TO YOUR SURGERY / PROCEDURE.   FOLLOW  ANY ADDITIONAL PRE OP INSTRUCTIONS YOU RECEIVED FROM YOUR SURGEON'S OFFICE!!!   Oral Hygiene is also important to reduce your risk of infection.        Remember - BRUSH YOUR TEETH THE MORNING OF SURGERY WITH YOUR REGULAR TOOTHPASTE  Do NOT smoke after Midnight the night before surgery.  STOP TAKING all Vitamins,  Herbs and supplements 1 week before your surgery.   Take ONLY these medicines the morning of surgery with A SIP OF WATER : Carvedilol , and you may take Tylenol  if needed. You may use your Flonase nasal spray and your Albuterol inhaler if needed. Please bring your Albuterol with you on the day of your surgery.                    You may not have any metal on your body including hair pins, jewelry, and body piercing  Do not wear make-up, lotions, powders, perfumes or deodorant  Do not wear nail polish including gel and S&S, artificial / acrylic nails, or any other type of covering on natural nails including finger and toenails. If you have artificial nails, gel coating, etc., that needs to be removed by a nail salon, Please have this removed prior to surgery. Not doing so may mean that your surgery could be cancelled or delayed if the Surgeon or anesthesia staff feels like they are unable to monitor you safely.   Do not shave 48 hours prior to surgery to avoid nicks in your skin which may contribute to postoperative infections.    Contacts, Hearing Aids, dentures or bridgework may not be worn into surgery. DENTURES WILL BE REMOVED PRIOR TO SURGERY PLEASE DO NOT APPLY "Poly grip" OR ADHESIVES!!!  Patients discharged on the day of surgery will not be allowed to drive home.  Someone NEEDS to stay with you for the first 24 hours after anesthesia.  Do not bring your home medications to the hospital. The Pharmacy will dispense medications listed on your medication list to you during your admission in the Hospital.  Special Instructions: Bring a copy of your healthcare power of attorney and living will documents the day of surgery, if you wish to have them scanned into your [facility] Records- EPIC  Please read over the following fact sheets you were given: IF YOU HAVE QUESTIONS ABOUT YOUR PRE-OP INSTRUCTIONS, PLEASE CALL [phone].   [facility] - Preparing for Surgery Before surgery, you  can play an important role.  Because skin is not sterile, your skin needs to be as free of germs as possible.  You can reduce the number of germs on your skin by washing with CHG (chlorahexidine gluconate) soap before surgery.  CHG is an antiseptic cleaner which kills germs and bonds with the skin to continue killing germs even after washing. Please DO NOT use if you have an allergy to CHG or antibacterial soaps.  If your skin becomes reddened/irritated stop using the CHG and inform your nurse when you arrive at Short Stay. Do not shave (including legs and underarms) for at least 48 hours prior to the first CHG shower.  You may shave your face/neck.  Please follow these instructions carefully:  1.  Shower with CHG Soap the night before surgery and the  morning of surgery.  2.  If you choose to wash your hair, wash your hair first as usual with your normal  shampoo.  3.  After you shampoo, rinse your hair and body thoroughly to remove the shampoo.                             4.  Use CHG as you would any other liquid soap.  You can apply chg directly to the skin and wash.  Gently with a scrungie or clean washcloth.  5.  Apply the CHG Soap to your body ONLY FROM THE NECK DOWN.   Do not use on face/ open                           Wound or open sores. Avoid contact with eyes, ears mouth and genitals (private parts).                       Wash face,  Genitals (private parts) with your normal soap.             6.  Wash thoroughly, paying special attention to the area where your  surgery  will be performed.  7.  Thoroughly rinse your body with warm water  from the neck down.  8.  DO NOT shower/wash with your normal soap after using and rinsing off the CHG Soap.            9.  Pat yourself dry with a clean towel.            10.  Wear clean pajamas.            11.  Place clean sheets on your bed the night of your first shower and do not  sleep with pets.  ON THE DAY OF SURGERY : Do not apply any  lotions/deodorants the morning of surgery.  Please wear clean clothes to the hospital/surgery center.  FAILURE TO FOLLOW THESE INSTRUCTIONS MAY RESULT IN THE CANCELLATION OF YOUR SURGERY  PATIENT SIGNATURE_________________________________  NURSE SIGNATURE__________________________________  ________________________________________________________________________

## 2023-09-02 ENCOUNTER — Encounter (HOSPITAL_COMMUNITY)
Admission: RE | Admit: 2023-09-02 | Discharge: 2023-09-02 | Disposition: A | Discharge status: Home / Self Care | Source: Ambulatory Visit | Attending: Urology | Admitting: Urology

## 2023-09-02 ENCOUNTER — Other Ambulatory Visit: Payer: Self-pay

## 2023-09-02 ENCOUNTER — Encounter (HOSPITAL_COMMUNITY): Payer: Self-pay

## 2023-09-02 VITALS — BP 156/64 | HR 58 | Temp 98.5°F | Resp 20 | Ht 60.0 in | Wt 153.0 lb

## 2023-09-02 DIAGNOSIS — Z87891 Personal history of nicotine dependence: Secondary | ICD-10-CM | POA: Diagnosis not present

## 2023-09-02 DIAGNOSIS — Z955 Presence of coronary angioplasty implant and graft: Secondary | ICD-10-CM | POA: Insufficient documentation

## 2023-09-02 DIAGNOSIS — E1151 Type 2 diabetes mellitus with diabetic peripheral angiopathy without gangrene: Secondary | ICD-10-CM | POA: Insufficient documentation

## 2023-09-02 DIAGNOSIS — K2270 Barrett's esophagus without dysplasia: Secondary | ICD-10-CM | POA: Diagnosis not present

## 2023-09-02 DIAGNOSIS — I2510 Atherosclerotic heart disease of native coronary artery without angina pectoris: Secondary | ICD-10-CM | POA: Diagnosis not present

## 2023-09-02 DIAGNOSIS — R001 Bradycardia, unspecified: Secondary | ICD-10-CM | POA: Insufficient documentation

## 2023-09-02 DIAGNOSIS — F419 Anxiety disorder, unspecified: Secondary | ICD-10-CM | POA: Insufficient documentation

## 2023-09-02 DIAGNOSIS — Z79899 Other long term (current) drug therapy: Secondary | ICD-10-CM | POA: Diagnosis not present

## 2023-09-02 DIAGNOSIS — K219 Gastro-esophageal reflux disease without esophagitis: Secondary | ICD-10-CM | POA: Diagnosis not present

## 2023-09-02 DIAGNOSIS — Z7984 Long term (current) use of oral hypoglycemic drugs: Secondary | ICD-10-CM | POA: Diagnosis not present

## 2023-09-02 DIAGNOSIS — C671 Malignant neoplasm of dome of bladder: Secondary | ICD-10-CM | POA: Insufficient documentation

## 2023-09-02 DIAGNOSIS — I10 Essential (primary) hypertension: Secondary | ICD-10-CM

## 2023-09-02 DIAGNOSIS — K760 Fatty (change of) liver, not elsewhere classified: Secondary | ICD-10-CM | POA: Diagnosis not present

## 2023-09-02 DIAGNOSIS — E119 Type 2 diabetes mellitus without complications: Secondary | ICD-10-CM

## 2023-09-02 DIAGNOSIS — Z01818 Encounter for other preprocedural examination: Secondary | ICD-10-CM

## 2023-09-02 LAB — CBC
HCT: 41.4 % (ref 36.0–46.0)
Hemoglobin: 13.1 g/dL (ref 12.0–15.0)
MCH: 32.7 pg (ref 26.0–34.0)
MCHC: 31.6 g/dL (ref 30.0–36.0)
MCV: 103.2 fL — ABNORMAL HIGH (ref 80.0–100.0)
Platelets: 196 10*3/uL (ref 150–400)
RBC: 4.01 MIL/uL (ref 3.87–5.11)
RDW: 11.8 % (ref 11.5–15.5)
WBC: 7.5 10*3/uL (ref 4.0–10.5)
nRBC: 0 % (ref 0.0–0.2)

## 2023-09-02 LAB — COMPREHENSIVE METABOLIC PANEL WITH GFR
ALT: 27 U/L (ref 0–44)
AST: 28 U/L (ref 15–41)
Albumin: 4.1 g/dL (ref 3.5–5.0)
Alkaline Phosphatase: 57 U/L (ref 38–126)
Anion gap: 7 (ref 5–15)
BUN: 14 mg/dL (ref 8–23)
CO2: 27 mmol/L (ref 22–32)
Calcium: 9.1 mg/dL (ref 8.9–10.3)
Chloride: 104 mmol/L (ref 98–111)
Creatinine, Ser: 0.61 mg/dL (ref 0.44–1.00)
GFR, Estimated: >60 mL/min (ref >60)
Glucose, Bld: 123 mg/dL — ABNORMAL HIGH (ref 70–99)
Potassium: 3.7 mmol/L (ref 3.5–5.1)
Sodium: 138 mmol/L (ref 135–145)
Total Bilirubin: 0.9 mg/dL (ref 0.0–1.2)
Total Protein: 7 g/dL (ref 6.5–8.1)

## 2023-09-02 LAB — GLUCOSE, CAPILLARY: Glucose-Capillary: 124 mg/dL — ABNORMAL HIGH (ref 70–99)

## 2023-09-05 ENCOUNTER — Encounter (HOSPITAL_COMMUNITY): Payer: Self-pay

## 2023-09-05 NOTE — Progress Notes (Signed)
 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  Past medical history significant for former smoking, HTN, CAD s/p PCI (2008, 2009), PAD s/p right CEA (2011), GERD, Barrett's esophagus, hepatic steatosis, type 2 diabetes (A1c 7.1 on 4/9), bladder cancer s/p TURBT, anxiety.  Prior anesthesia complications include PONV, post op hypoxia, and difficult intubation (Hx of having a "crooked" airway at [facility] and postoperative horseness/sore thoat. Intubated in 2013 at [facility] and was an easy mask, grade 1 view with glidescope and  ETT passed without difficulty.) No difficulty noted on multiple subsequent procedures with LMA  Patient follows with cardiology at [facility]. Last seen by Dr. Abbie Hock on 01/12/2023.  Her last assessment of her underlying coronary artery disease was a stress nuclear study on January 08, 2021. This revealed no ischemia with a left ventricular ejection fraction of 52%.  She denies any cardiac symptoms at last office visit.  She was advised to continue current medication regimen and follow-up in 6 months.  Of note she will be following with a [facility] cardiologist and has an appointment upcoming in July.  Last seen by PCP on 08/24/2023.  It was noted she would having some wheezing at the time, suspected due to allergies.  She was prescribed inhalers and Tessalon  VS: BP (!) 156/64 Comment: left arm sitting  Pulse (!) 58   Temp 36.9 C (Oral)   Resp 20   Ht 5' (1.524 m)   Wt 69.4 kg   SpO2 94%   BMI 29.88 kg/m   PROVIDERS: Monique Ano, MD   LABS: Labs reviewed: Acceptable for  surgery. (all labs ordered are listed, but only abnormal results are displayed)  Labs Reviewed  COMPREHENSIVE METABOLIC PANEL WITH GFR - Abnormal; Notable for the following components:      Result Value   Glucose, Bld 123 (*)    All other components within normal limits  CBC - Abnormal; Notable for the following components:   MCV 103.2 (*)    All other components within normal limits  GLUCOSE, CAPILLARY - Abnormal; Notable for the following components:   Glucose-Capillary 124 (*)    All other components within normal limits     IMAGES:   EKG 09/02/23  Sinus bradycardia, rate 55 Minimal voltage criteria for LVH, may be normal variant ( R in aVL ) Possible Anterior infarct , age undetermined  CV:  Stress test 01/08/2021:  Impression  Myocardial perfusion imaging is Normal. Normal ECG response to vasodilator infusion Summed severity score is normal. Overall left ventricular systolic function was Normal without regional wall motion abnormalities (see above). No prior study available for comparison.  Echo 07/30/2014:  INTERPRETATION ---------------------------------------------------------------   NORMAL LEFT VENTRICULAR SYSTOLIC FUNCTION WITH MILD LVH   NORMAL LA PRESSURES WITH NORMAL DIASTOLIC FUNCTION   NORMAL RIGHT VENTRICULAR SYSTOLIC FUNCTION   VALVULAR REGURGITATION: MILD MR, MILD PR, TRIVIAL TR   NO VALVULAR STENOSIS   TRIVIAL PERICARDIAL EFFUSION   Compared with prior Echo study on 09/08/2011: NO SIGNIFICANT CHANGES  Past Medical History:  Diagnosis Date   Allergic genetic state    Anxiety  Arthritis    B12 deficiency    Barrett esophagus    followed by GI   Bladder cancer [facility])    urologist--- dr Derrick Fling;   s/p TURBT x2   Chronic low back pain    Complication of anesthesia    PER PT WITH SURGERY AT [facility] 10-14-2020  PT STATED HAD ISSUE WITH LOW O2 SAT DISCHARGE AT HOME IN THE 80s FOR 24 HOURS   Coronary artery disease    cardiologist--- dr Lary Point  sketch ([facility] in [place])  10/ 2008  s/p PTCA w/ DES to mid LAD;   09/ 2009  s/p PTCA balloon to ISR;  nuclear stress test 01-08-2021 in care everywhere, normal perfusion no ischemia, nuclear ef 52%   Diet-controlled type 2 diabetes mellitus (HCC)    Difficult intubation    per pt was told at time of carotid surgery had crooked airway (2011 @ [facility]);  refer to anesthesia record from back surgery @ [facility] 06-07-2011 used glidescop without issues//  per anesthesia records at [facility] 10-14-2020 and [facility] 07-31-2021 no issues   Diverticulosis    Fatty liver    Fibrocystic breast disease    GERD (gastroesophageal reflux disease)    Headache    since starting Cosentyx    History of COVID-19 04/2021   per pt mild symptoms that resolved   History of lower GI bleeding 2013   hospital admission--  retroperitoneal bleed in setting of taking plavix ,  post blood transfusion,   Hyperlipidemia, mixed    Lower urinary tract symptoms (LUTS)    Multilevel spondylosis    Osteoporosis    PAD (peripheral artery disease) (HCC)    s/p right CEA in 2011   Pneumonia    PONV (postoperative nausea and vomiting)    Psoriasis    PVC's (premature ventricular contractions)    Rectal sphincter incontinence    followed by GI-- dr Corky Diener;  per pt occasional intermittant   Resistant hypertension    followed by cardiologist and pcp   S/P drug eluting coronary stent placement    1998  to midLAD and balloon angioplasty to ISR 1999   Urinary incontinence    at night    Past Surgical History:  Procedure Laterality Date   BELPHAROPTOSIS REPAIR Bilateral 2014   CARDIAC CATHETERIZATION  09/17/2008   @[facility]  ;  20% mRCA, 20% ostial RCA, 10% OM2,  patent previous stents, ef 68%   CARDIAC CATHETERIZATION     05-14-2010;  09-09-2011;  lastt one 07-30-2014 @[facility] , all show , patent stents and no change since previous cath   CAROTID ENDARTERECTOMY Right 07/10/2009   @[facility]    CARPAL TUNNEL RELEASE Right 2005   CATARACT EXTRACTION  W/ INTRAOCULAR LENS  IMPLANT, BILATERAL  2015   CHOLECYSTECTOMY OPEN  1990   W/  APPENDECTOMY   COLONOSCOPY WITH PROPOFOL  N/A 03/09/2017   Procedure: COLONOSCOPY WITH PROPOFOL ;  Surgeon: Cassie Click, MD;  Location: [facility] ENDOSCOPY;  Service: Endoscopy;  Laterality: N/A;   CORONARY ANGIOPLASTY WITH STENT PLACEMENT  02/2007   @[facility] ;   DES X1 to mLAD   CORONARY ANGIOPLASTY WITH STENT PLACEMENT  01/2008   @[facility]  ;  DES X1  to mLAD   CYSTOSCOPY WITH BIOPSY N/A 05/16/2015   Procedure: CYSTOSCOPY WITH BIOPSY AND FULGERATION ;  Surgeon: Christina Coyer, MD;  Location: [facility];  Service: Urology;  Laterality: N/A;   CYSTOSCOPY WITH BIOPSY N/A 07/09/2022   Procedure: CYSTOSCOPY WITH BLADDER BIOPSY (0.5-2cm) POST OP GEMCITABINE ;  Surgeon: Christina Coyer,  MD;  Location: [facility];  Service: Urology;  Laterality: N/A;   CYSTOSCOPY WITH FULGERATION N/A 07/31/2021   Procedure: CYSTOSCOPY WITH FULGERATION/ BLADDER BIOPSY/ POSTOPERATIVE INSTILLATION OF EPIRUBICIN;  Surgeon: Christina Coyer, MD;  Location: [facility];  Service: Urology;  Laterality: N/A;   ESOPHAGOGASTRODUODENOSCOPY (EGD) WITH PROPOFOL  N/A 04/07/2020   Procedure: ESOPHAGOGASTRODUODENOSCOPY (EGD) WITH PROPOFOL ;  Surgeon: [place], Alphonsus Jeans, MD;  Location: [facility] ENDOSCOPY;  Service: Gastroenterology;  Laterality: N/A;   EYE SURGERY  07/2013   excision cyst right eyelid   LUMBAR LAMINECTOMY/DECOMPRESSION MICRODISCECTOMY  06/07/2011   Procedure: LUMBAR LAMINECTOMY/DECOMPRESSION MICRODISCECTOMY 2 LEVELS;  Surgeon: Ferris Hua, MD;  Location[facility];  Service: Neurosurgery;  Laterality: Left;   Lumbar Two-Three, Lumbar Three-Four Decompressive Lumbar Laminectomy   SHOULDER ARTHROSCOPY WITH OPEN ROTATOR CUFF REPAIR Right 02/20/2015   Procedure: SHOULDER ARTHROSCOPY WITH OPEN ROTATOR CUFF REPAIR and decompression;  Surgeon: Elner Hahn, MD;  Location: [facility] ORS;  Service: Orthopedics;   Laterality: Right;   TONSILLECTOMY AND ADENOIDECTOMY  1956   TRANSURETHRAL RESECTION OF BLADDER TUMOR N/A 10/14/2020   Procedure: TRANSURETHRAL RESECTION OF BLADDER TUMOR (TURBT)/ CYSTOSCOPY/ POST OPERATIVE INSTILLATION OF GEMCITABINE / BILATERAL RETROGRADE;  Surgeon: Christina Coyer, MD;  Location[facility];  Service: Urology;  Laterality: N/A;  ONLY NEEDS 60 MIN   TRANSURETHRAL RESECTION OF BLADDER TUMOR WITH MITOMYCIN -C N/A 02/05/2014   Procedure: TRANSURETHRAL RESECTION OF BLADDER TUMOR ;  Surgeon: Christina Coyer, MD;  Location: [facility];  Service: Urology;  Laterality: N/A;   VAGINAL HYSTERECTOMY  1976    MEDICATIONS:  acetaminophen  (TYLENOL ) 500 MG tablet   albuterol (VENTOLIN HFA) 108 (90 Base) MCG/ACT inhaler   aspirin  EC 81 MG tablet   atorvastatin  (LIPITOR ) 40 MG tablet   benazepril  (LOTENSIN ) 40 MG tablet   benzonatate (TESSALON) 200 MG capsule   Calcipotriene  0.005 % solution   Camphor-Menthol -Methyl Sal (SALONPAS) 3.05-15-08 % PTCH   carvedilol  (COREG ) 25 MG tablet   clobetasol  (TEMOVATE ) 0.05 % external solution   escitalopram  (LEXAPRO ) 20 MG tablet   esomeprazole (NEXIUM) 40 MG capsule   fluocinonide  (LIDEX ) 0.05 % external solution   fluticasone (FLONASE) 50 MCG/ACT nasal spray   hydrocortisone  2.5 % cream   hydrOXYzine  (ATARAX ) 10 MG tablet   metFORMIN (GLUCOPHAGE-XR) 500 MG 24 hr tablet   Multiple Vitamins-Minerals (MULTIVITAMINS THER. W/MINERALS) TABS   nitroGLYCERIN  (NITROSTAT ) 0.4 MG SL tablet   Secukinumab , 300 MG Dose, (COSENTYX  SENSOREADY, 300 MG,) 150 MG/ML SOAJ   Secukinumab , 300 MG Dose, (COSENTYX  SENSOREADY, 300 MG,) 150 MG/ML SOAJ   No current facility-administered medications for this encounter.    Antoinette Kirschner [facility] Short Stay/Anesthesiology [facility] Phone [phone] 09/05/2023 10:46 AM

## 2023-09-05 NOTE — Anesthesia Preprocedure Evaluation (Signed)
 Anesthesia Evaluation  Patient identified by MRN, date of birth, ID band Patient awake    Reviewed: Allergy & Precautions, H&P , NPO status , Patient's Chart, lab work & pertinent test results, reviewed documented beta blocker date and time   History of Anesthesia Complications (+) PONV, DIFFICULT AIRWAY and history of anesthetic complications (glidescope used w/o issues, LMA #3 used for past surgeries)  Airway Mallampati: III  TM Distance: >3 FB Neck ROM: Full    Dental  (+) Teeth Intact, Dental Advisory Given   Pulmonary Patient abstained from smoking., former smoker Required oxygen for a while after last few surgeries. Now has home O2 so she is able to be discharged to home after procedure   Pulmonary exam normal breath sounds clear to auscultation       Cardiovascular hypertension (136/59 preop), Pt. on medications and Pt. on home beta blockers + CAD and + Cardiac Stents (1998 DES LAD)  Normal cardiovascular exam Rhythm:Regular Rate:Normal     Neuro/Psych  Headaches PSYCHIATRIC DISORDERS Anxiety        GI/Hepatic Neg liver ROS,GERD  ,,  Endo/Other  diabetes, Well Controlled, Type 2, Oral Hypoglycemic Agents    Renal/GU negative Renal ROS  negative genitourinary   Musculoskeletal  (+) Arthritis , Osteoarthritis,    Abdominal   Peds negative pediatric ROS (+)  Hematology negative hematology ROS (+)   Anesthesia Other Findings   Reproductive/Obstetrics negative OB ROS                             Anesthesia Physical Anesthesia Plan  ASA: 3  Anesthesia Plan: General   Post-op Pain Management: Tylenol  PO (pre-op)*   Induction: Intravenous  PONV Risk Score and Plan: 4 or greater and Ondansetron , Dexamethasone , Midazolam  and Treatment may vary due to age or medical condition  Airway Management Planned: LMA  Additional Equipment: None  Intra-op Plan:   Post-operative Plan:  Extubation in OR  Informed Consent: I have reviewed the patients History and Physical, chart, labs and discussed the procedure including the risks, benefits and alternatives for the proposed anesthesia with the patient or authorized representative who has indicated his/her understanding and acceptance.     Dental advisory given  Plan Discussed with: CRNA  Anesthesia Plan Comments: (LMA #3 in past)        Anesthesia Quick Evaluation

## 2023-09-06 ENCOUNTER — Encounter (HOSPITAL_COMMUNITY): Payer: Self-pay | Admitting: Urology

## 2023-09-06 ENCOUNTER — Encounter (HOSPITAL_COMMUNITY)
Admission: RE | Disposition: A | Discharge status: Home / Self Care | Payer: Self-pay | Source: Home / Self Care | Attending: Urology

## 2023-09-06 ENCOUNTER — Ambulatory Visit (HOSPITAL_COMMUNITY)
Admission: RE | Admit: 2023-09-06 | Discharge: 2023-09-06 | Disposition: A | Discharge status: Home / Self Care | Attending: Urology | Admitting: Urology

## 2023-09-06 ENCOUNTER — Ambulatory Visit (HOSPITAL_COMMUNITY): Payer: Self-pay | Admitting: Medical

## 2023-09-06 ENCOUNTER — Other Ambulatory Visit: Payer: Self-pay

## 2023-09-06 ENCOUNTER — Ambulatory Visit (HOSPITAL_BASED_OUTPATIENT_CLINIC_OR_DEPARTMENT_OTHER)

## 2023-09-06 ENCOUNTER — Ambulatory Visit: Payer: Medicare Other | Admitting: Dermatology

## 2023-09-06 DIAGNOSIS — F419 Anxiety disorder, unspecified: Secondary | ICD-10-CM | POA: Diagnosis not present

## 2023-09-06 DIAGNOSIS — E119 Type 2 diabetes mellitus without complications: Secondary | ICD-10-CM | POA: Insufficient documentation

## 2023-09-06 DIAGNOSIS — D414 Neoplasm of uncertain behavior of bladder: Secondary | ICD-10-CM

## 2023-09-06 DIAGNOSIS — Z87891 Personal history of nicotine dependence: Secondary | ICD-10-CM | POA: Insufficient documentation

## 2023-09-06 DIAGNOSIS — C679 Malignant neoplasm of bladder, unspecified: Secondary | ICD-10-CM

## 2023-09-06 DIAGNOSIS — I10 Essential (primary) hypertension: Secondary | ICD-10-CM | POA: Insufficient documentation

## 2023-09-06 DIAGNOSIS — Z7984 Long term (current) use of oral hypoglycemic drugs: Secondary | ICD-10-CM | POA: Insufficient documentation

## 2023-09-06 DIAGNOSIS — D090 Carcinoma in situ of bladder: Secondary | ICD-10-CM | POA: Diagnosis not present

## 2023-09-06 DIAGNOSIS — K219 Gastro-esophageal reflux disease without esophagitis: Secondary | ICD-10-CM | POA: Insufficient documentation

## 2023-09-06 DIAGNOSIS — I2510 Atherosclerotic heart disease of native coronary artery without angina pectoris: Secondary | ICD-10-CM | POA: Diagnosis not present

## 2023-09-06 DIAGNOSIS — Z955 Presence of coronary angioplasty implant and graft: Secondary | ICD-10-CM | POA: Diagnosis not present

## 2023-09-06 DIAGNOSIS — C671 Malignant neoplasm of dome of bladder: Secondary | ICD-10-CM | POA: Diagnosis not present

## 2023-09-06 DIAGNOSIS — D494 Neoplasm of unspecified behavior of bladder: Secondary | ICD-10-CM | POA: Diagnosis not present

## 2023-09-06 DIAGNOSIS — Z01818 Encounter for other preprocedural examination: Secondary | ICD-10-CM

## 2023-09-06 HISTORY — PX: CYSTOSCOPY WITH FULGERATION: SHX6638

## 2023-09-06 LAB — GLUCOSE, CAPILLARY
Glucose-Capillary: 154 mg/dL — ABNORMAL HIGH (ref 70–99)
Glucose-Capillary: 152 mg/dL — ABNORMAL HIGH (ref 70–99)

## 2023-09-06 SURGERY — CYSTOSCOPY, WITH BLADDER FULGURATION
Anesthesia: General

## 2023-09-06 MED ORDER — FENTANYL CITRATE PF 50 MCG/ML IJ SOSY
PREFILLED_SYRINGE | INTRAMUSCULAR | Status: AC
  Administered 2023-09-06: 50 ug via INTRAVENOUS
  Filled 2023-09-06: qty 1

## 2023-09-06 MED ORDER — OXYBUTYNIN CHLORIDE 5 MG PO TABS
5.0000 mg | ORAL_TABLET | Freq: Two times a day (BID) | ORAL | Status: AC
  Administered 2023-09-06: 5 mg via ORAL

## 2023-09-06 MED ORDER — FENTANYL CITRATE (PF) 100 MCG/2ML IJ SOLN
INTRAMUSCULAR | Status: AC
  Filled 2023-09-06: qty 2

## 2023-09-06 MED ORDER — SODIUM CHLORIDE 0.9 % IR SOLN
Status: DC | PRN
  Administered 2023-09-06: 1000 mL

## 2023-09-06 MED ORDER — FENTANYL CITRATE PF 50 MCG/ML IJ SOSY
25.0000 ug | PREFILLED_SYRINGE | INTRAMUSCULAR | Status: DC | PRN
  Administered 2023-09-06: 50 ug via INTRAVENOUS

## 2023-09-06 MED ORDER — ONDANSETRON HCL 4 MG/2ML IJ SOLN
INTRAMUSCULAR | Status: DC | PRN
Start: 2023-09-06 — End: 2023-09-06
  Administered 2023-09-06: 4 mg via INTRAVENOUS

## 2023-09-06 MED ORDER — LACTATED RINGERS IV SOLN: INTRAVENOUS | Status: DC

## 2023-09-06 MED ORDER — LIDOCAINE HCL (PF) 2 % IJ SOLN
INTRAMUSCULAR | Status: AC
  Filled 2023-09-06: qty 5

## 2023-09-06 MED ORDER — ORAL CARE MOUTH RINSE: 15.0000 mL | Freq: Once | OROMUCOSAL | Status: AC

## 2023-09-06 MED ORDER — ONDANSETRON HCL 4 MG/2ML IJ SOLN: 4.0000 mg | Freq: Once | INTRAMUSCULAR | Status: DC | PRN

## 2023-09-06 MED ORDER — ONDANSETRON HCL 4 MG/2ML IJ SOLN
INTRAMUSCULAR | Status: AC
  Filled 2023-09-06: qty 2

## 2023-09-06 MED ORDER — DEXAMETHASONE SODIUM PHOSPHATE 10 MG/ML IJ SOLN
INTRAMUSCULAR | Status: AC
  Filled 2023-09-06: qty 1

## 2023-09-06 MED ORDER — LIDOCAINE HCL (CARDIAC) PF 100 MG/5ML IV SOSY
PREFILLED_SYRINGE | INTRAVENOUS | Status: DC | PRN
  Administered 2023-09-06: 60 mg via INTRAVENOUS

## 2023-09-06 MED ORDER — STERILE WATER FOR IRRIGATION IR SOLN
Status: DC | PRN
  Administered 2023-09-06: 3000 mL

## 2023-09-06 MED ORDER — GEMCITABINE CHEMO FOR BLADDER INSTILLATION 2000 MG
2000.0000 mg | Freq: Once | INTRAVENOUS | Status: AC
  Administered 2023-09-06: 2000 mg via INTRAVESICAL
  Filled 2023-09-06: qty 2000

## 2023-09-06 MED ORDER — PROPOFOL 10 MG/ML IV BOLUS
INTRAVENOUS | Status: DC | PRN
  Administered 2023-09-06: 150 mg via INTRAVENOUS

## 2023-09-06 MED ORDER — FENTANYL CITRATE PF 50 MCG/ML IJ SOSY
PREFILLED_SYRINGE | INTRAMUSCULAR | Status: AC
  Filled 2023-09-06: qty 1

## 2023-09-06 MED ORDER — MIDAZOLAM HCL 5 MG/5ML IJ SOLN
INTRAMUSCULAR | Status: DC | PRN
  Administered 2023-09-06: 1 mg via INTRAVENOUS

## 2023-09-06 MED ORDER — OXYBUTYNIN CHLORIDE 5 MG PO TABS
ORAL_TABLET | ORAL | Status: DC
Start: 2023-09-06 — End: 2023-09-06
  Filled 2023-09-06: qty 1

## 2023-09-06 MED ORDER — DEXAMETHASONE SODIUM PHOSPHATE 10 MG/ML IJ SOLN
INTRAMUSCULAR | Status: DC | PRN
  Administered 2023-09-06: 6 mg via INTRAVENOUS

## 2023-09-06 MED ORDER — INSULIN ASPART 100 UNIT/ML IJ SOLN: 0.0000 [IU] | INTRAMUSCULAR | Status: DC | PRN

## 2023-09-06 MED ORDER — PROPOFOL 10 MG/ML IV BOLUS
INTRAVENOUS | Status: AC
  Filled 2023-09-06: qty 20

## 2023-09-06 MED ORDER — CEFAZOLIN SODIUM-DEXTROSE 2-4 GM/100ML-% IV SOLN
2.0000 g | INTRAVENOUS | Status: AC
  Administered 2023-09-06: 2 g via INTRAVENOUS
  Filled 2023-09-06: qty 100

## 2023-09-06 MED ORDER — FENTANYL CITRATE (PF) 100 MCG/2ML IJ SOLN
INTRAMUSCULAR | Status: DC | PRN
  Administered 2023-09-06: 50 ug via INTRAVENOUS

## 2023-09-06 MED ORDER — MIDAZOLAM HCL 2 MG/2ML IJ SOLN
INTRAMUSCULAR | Status: AC
  Filled 2023-09-06: qty 2

## 2023-09-06 MED ORDER — CHLORHEXIDINE GLUCONATE 0.12 % MT SOLN
15.0000 mL | Freq: Once | OROMUCOSAL | Status: AC
  Administered 2023-09-06: 15 mL via OROMUCOSAL

## 2023-09-06 SURGICAL SUPPLY — 13 items
BAG URINE DRAIN 2000ML AR STRL (UROLOGICAL SUPPLIES) IMPLANT
BAG URO CATCHER STRL LF (MISCELLANEOUS) ×1 IMPLANT
CATH FOLEY 2WAY SLVR 5CC 16FR (CATHETERS) IMPLANT
DRAPE FOOT SWITCH (DRAPES) ×1 IMPLANT
GLOVE SURG LX STRL 7.5 STRW (GLOVE) ×1 IMPLANT
GOWN STRL REUS W/ TWL XL LVL3 (GOWN DISPOSABLE) ×1 IMPLANT
KIT TURNOVER KIT A (KITS) IMPLANT
LOOP CUT BIPOLAR 24F LRG (ELECTROSURGICAL) IMPLANT
MANIFOLD NEPTUNE II (INSTRUMENTS) ×1 IMPLANT
PACK CYSTO (CUSTOM PROCEDURE TRAY) ×1 IMPLANT
PLUG CATH AND CAP STRL 200 (CATHETERS) IMPLANT
TUBING CONNECTING 10 (TUBING) ×1 IMPLANT
TUBING UROLOGY SET (TUBING) ×1 IMPLANT

## 2023-09-06 NOTE — Op Note (Signed)
 Preoperative diagnosis: Bladder neoplasm Postoperative diagnosis: Same  Procedure: Cystoscopy with bladder biopsy and fulguration, postoperative instillation of gemcitabine  in PACU  Surgeon: Derrick Fling  Anesthesia: General  Indication for procedure: Meagan Osborne is a 78 year old female with a history of bladder cancer.  She had a papillary appearing recurrence posterior superior of toward the dome and was brought today for cystoscopy under anesthesia with bladder biopsy and fulguration.  Findings: On exam she had a benign appearing small smooth urethral carbuncle.  She had a grade 1 cystocele and a grade 2 rectocele.  Bladder was palpably normal.  On cystoscopy the urethra was unremarkable.  The trigone and ureteral orifices were in their normal orthotopic position with clear E flux.  At the posterior superior there was a papillary appearing recurrence which was biopsied and fulgurated.  There was a faint papillary appearance to a small area on the left lateral bladder wall toward the bladder neck which was simply fulgurated.  No other abnormalities were noted.  Description of procedure: After consent was obtained patient brought to the operating room.  After adequate anesthesia she is placed lithotomy position and prepped and draped in the usual sterile fashion.  Timeout was performed to confirm the patient and procedure.  Exam was performed.  Cystoscope was passed per urethra the bladder carefully inspected with a 30 and 70 degree lens.  I then took the Bugbee electrode and fulgurated a small area of the left anterior bladder wall.  I then took the rigid biopsy forceps and biopsied the posterior superior papillary appearing area.  This area was fulgurated.  Hemostasis was excellent at low pressure no concern for perforation.  Biopsy site was 0.5 mm.  The scope was backed out and a 16 [place] Foley catheter was placed left to gravity drainage with clear urine.  She was awakened taken recovery room in stable  condition.  Postoperative instillation of gemcitabine : Gemcitabine  2000 mg was per urethra into the bladder instilled in PACU left to dwell for 50 minutes and then drained.  Foley catheter was removed.  Complications: None  Blood loss: Minimal  Specimens to pathology: 1) posterior superior bladder biopsy  Drains: 16 French Foley catheter  Disposition: Patient stable to PACU. I left a detailed VM for Gene.

## 2023-09-06 NOTE — Anesthesia Postprocedure Evaluation (Signed)
 Anesthesia Post Note  Patient: Meagan Osborne  Procedure(s) Performed: CYSTOSCOPY, WITH BLADDER FULGURATION AND BIOPSY     Patient location during evaluation: PACU Anesthesia Type: General Level of consciousness: awake and alert, oriented and patient cooperative Pain management: pain level controlled Vital Signs Assessment: post-procedure vital signs reviewed and stable Respiratory status: spontaneous breathing, nonlabored ventilation and respiratory function stable Cardiovascular status: blood pressure returned to baseline and stable Postop Assessment: no apparent nausea or vomiting Anesthetic complications: no   No notable events documented.  Last Vitals:  Vitals:   09/06/23 1036 09/06/23 1045  BP:  (!) 159/92  Pulse: (!) 51 (!) 54  Resp: 14 17  Temp:  36.6 C  SpO2: 96% 96%    Last Pain:  Vitals:   09/06/23 1105  TempSrc:   PainSc: 8                  Jacquelyne Matte

## 2023-09-06 NOTE — Interval H&P Note (Signed)
 History and Physical Interval Note:  09/06/2023 9:03 AM  Meagan Osborne  has presented today for surgery, with the diagnosis of BLADDER CANCER.  The various methods of treatment have been discussed with the patient and family. After consideration of risks, benefits and other options for treatment, the patient has consented to  Procedure(s) with comments: CYSTOSCOPY, WITH BLADDER FULGURATION (N/A) - BLADDER BIOPSY AND FULGURATION WITH INSTILLATION OF GEMCITABINE  as a surgical intervention.  The patient's history has been reviewed, patient examined, no change in status, stable for surgery.  I have reviewed the patient's chart and labs.  She is well today without dysuria or gross hematuria.  No cough cold or congestion.  No fever.  Discussed possible need for Foley catheter postoperatively.  Questions were answered to the patient's satisfaction.     Christina Coyer

## 2023-09-06 NOTE — Transfer of Care (Signed)
 Immediate Anesthesia Transfer of Care Note  Patient: Meagan Osborne  Procedure(s) Performed: CYSTOSCOPY, WITH BLADDER FULGURATION AND BIOPSY  Patient Location: PACU  Anesthesia Type:General  Level of Consciousness: awake and sedated  Airway & Oxygen Therapy: Patient Spontanous Breathing and Patient connected to face mask oxygen  Post-op Assessment: Report given to RN and Post -op Vital signs reviewed and stable  Post vital signs: Reviewed and stable  Last Vitals:  Vitals Value Taken Time  BP 167/67 09/06/23 1022  Temp    Pulse 51 09/06/23 1024  Resp 13 09/06/23 1024  SpO2 95 % 09/06/23 1024  Vitals shown include unfiled device data.  Last Pain:  Vitals:   09/06/23 0725  TempSrc:   PainSc: 0-No pain      Patients Stated Pain Goal: 4 (09/06/23 0725)  Complications: No notable events documented.

## 2023-09-06 NOTE — Anesthesia Procedure Notes (Signed)
 Procedure Name: LMA Insertion Date/Time: 09/06/2023 9:43 AM  Performed by: Kaydence Menard C, CRNAPre-anesthesia Checklist: Patient identified, Emergency Drugs available, Suction available and Patient being monitored Patient Re-evaluated:Patient Re-evaluated prior to induction Oxygen Delivery Method: Circle System Utilized Preoxygenation: Pre-oxygenation with 100% oxygen Induction Type: IV induction Ventilation: Mask ventilation without difficulty LMA: LMA inserted LMA Size: 3.0 Number of attempts: 1 Airway Equipment and Method: Bite block Placement Confirmation: positive ETCO2 Tube secured with: Tape Dental Injury: Teeth and Oropharynx as per pre-operative assessment

## 2023-09-07 ENCOUNTER — Encounter (HOSPITAL_COMMUNITY): Payer: Self-pay | Admitting: Urology

## 2023-09-07 LAB — SURGICAL PATHOLOGY

## 2023-10-06 DIAGNOSIS — J069 Acute upper respiratory infection, unspecified: Secondary | ICD-10-CM | POA: Diagnosis not present

## 2023-11-02 DIAGNOSIS — M79602 Pain in left arm: Secondary | ICD-10-CM | POA: Diagnosis not present

## 2023-11-02 DIAGNOSIS — M25562 Pain in left knee: Secondary | ICD-10-CM | POA: Diagnosis not present

## 2023-11-02 DIAGNOSIS — M778 Other enthesopathies, not elsewhere classified: Secondary | ICD-10-CM | POA: Diagnosis not present

## 2023-11-14 DIAGNOSIS — M5412 Radiculopathy, cervical region: Secondary | ICD-10-CM | POA: Diagnosis not present

## 2023-11-15 ENCOUNTER — Ambulatory Visit: Attending: Cardiovascular Disease | Admitting: Cardiovascular Disease

## 2023-11-15 ENCOUNTER — Encounter: Payer: Self-pay | Admitting: Cardiovascular Disease

## 2023-11-15 VITALS — BP 116/60 | HR 63 | Ht 60.0 in | Wt 150.0 lb

## 2023-11-15 DIAGNOSIS — I2510 Atherosclerotic heart disease of native coronary artery without angina pectoris: Secondary | ICD-10-CM | POA: Diagnosis not present

## 2023-11-15 DIAGNOSIS — I10 Essential (primary) hypertension: Secondary | ICD-10-CM | POA: Diagnosis not present

## 2023-11-15 DIAGNOSIS — E785 Hyperlipidemia, unspecified: Secondary | ICD-10-CM | POA: Diagnosis not present

## 2023-11-15 NOTE — Progress Notes (Signed)
 Cardiology Office Note   Date:  11/15/2023   ID:  Meagan Osborne, DOB Feb 19, 1946, MRN [id number]  PCP:  Alla Amis, MD  Cardiologist:   Deatrice Cage, MD   Chief Complaint  Patient presents with   New Patient (Initial Visit)    Est. Care. Meds reviewed verbally with pt.      History of Present Illness: Meagan Osborne is a 78 y.o. female who presents to establish cardiovascular care. She was previously seen by Dr. Carvin at [facility] most recently in September 2024. She has known history of coronary artery disease, essential hypertension and hyperlipidemia.  She had PCI and drug-eluting stent placement to the mid LAD in October 2008.  She had recurrent restenosis in 2009 which was treated with another overlapped stent.  Since that time, she has undergone multiple relook cardiac catheterization which revealed insignificant coronary artery disease.  Most recent ischemic cardiac evaluation was in September 2022 with a nuclear stress test which showed no evidence of ischemia.  She was recently diagnosed with type 2 diabetes.  She has been doing extremely well with no chest pain, shortness of breath or palpitations.  She takes her medications regularly.  She is not a smoker.  She has no family history of coronary artery disease.  She might need to have back surgery in the near future.  She has no physical limitations and is able to do activities of daily living without issues.    Past Medical History:  Diagnosis Date   Allergic genetic state    Anxiety    Arthritis    B12 deficiency    Barrett esophagus    followed by GI   Bladder cancer [facility])    urologist--- dr nieves;   s/p TURBT x2   Chronic low back pain    Complication of anesthesia    PER PT WITH SURGERY AT [facility] 10-14-2020  PT STATED HAD ISSUE WITH LOW O2 SAT DISCHARGE AT HOME IN THE 80s FOR 24 HOURS   Coronary artery disease    cardiologist--- dr channie sketch ([facility] in [place])  10/ 2008  s/p PTCA w/ DES to mid  LAD;   09/ 2009  s/p PTCA balloon to ISR;  nuclear stress test 01-08-2021 in care everywhere, normal perfusion no ischemia, nuclear ef 52%   Diet-controlled type 2 diabetes mellitus (HCC)    Difficult intubation    per pt was told at time of carotid surgery had crooked airway (2011 @ [facility]);  refer to anesthesia record from back surgery @ [facility] 06-07-2011 used glidescop without issues//  per anesthesia records at [facility] 10-14-2020 and [facility] 07-31-2021 no issues   Diverticulosis    Fatty liver    Fibrocystic breast disease    GERD (gastroesophageal reflux disease)    Headache    since starting Cosentyx    History of COVID-19 04/2021   per pt mild symptoms that resolved   History of lower GI bleeding 2013   hospital admission--  retroperitoneal bleed in setting of taking plavix ,  post blood transfusion,   Hyperlipidemia, mixed    Lower urinary tract symptoms (LUTS)    Multilevel spondylosis    Osteoporosis    PAD (peripheral artery disease) (HCC)    s/p right CEA in 2011   Pneumonia    PONV (postoperative nausea and vomiting)    Psoriasis    PVC's (premature ventricular contractions)    Rectal sphincter incontinence    followed by GI-- dr aundria;  per pt occasional  intermittant   Resistant hypertension    followed by cardiologist and pcp   S/P drug eluting coronary stent placement    1998  to midLAD and balloon angioplasty to ISR 1999   Urinary incontinence    at night    Past Surgical History:  Procedure Laterality Date   BELPHAROPTOSIS REPAIR Bilateral 2014   CARDIAC CATHETERIZATION  09/17/2008   @[facility]  ;  20% mRCA, 20% ostial RCA, 10% OM2,  patent previous stents, ef 68%   CARDIAC CATHETERIZATION     05-14-2010;  09-09-2011;  lastt one 07-30-2014 @[facility] , all show , patent stents and no change since previous cath   CAROTID ENDARTERECTOMY Right 07/10/2009   @[facility]    CARPAL TUNNEL RELEASE Right 2005   CATARACT EXTRACTION W/ INTRAOCULAR LENS  IMPLANT, BILATERAL  2015    CHOLECYSTECTOMY OPEN  1990   W/  APPENDECTOMY   COLONOSCOPY WITH PROPOFOL  N/A 03/09/2017   Procedure: COLONOSCOPY WITH PROPOFOL ;  Surgeon: Viktoria Lamar DASEN, MD;  Location: [facility]COPY;  Service: Endoscopy;  Laterality: N/A;   CORONARY ANGIOPLASTY WITH STENT PLACEMENT  02/2007   @[facility] ;   DES X1 to mLAD   CORONARY ANGIOPLASTY WITH STENT PLACEMENT  01/2008   @[facility]  ;  DES X1  to mLAD   CYSTOSCOPY WITH BIOPSY N/A 05/16/2015   Procedure: CYSTOSCOPY WITH BIOPSY AND FULGERATION ;  Surgeon: Donnice Brooks, MD;  Location: [facility];  Service: Urology;  Laterality: N/A;   CYSTOSCOPY WITH BIOPSY N/A 07/09/2022   Procedure: CYSTOSCOPY WITH BLADDER BIOPSY (0.5-2cm) POST OP GEMCITABINE ;  Surgeon: Brooks Donnice, MD;  Location: [facility];  Service: Urology;  Laterality: N/A;   CYSTOSCOPY WITH FULGERATION N/A 07/31/2021   Procedure: CYSTOSCOPY WITH FULGERATION/ BLADDER BIOPSY/ POSTOPERATIVE INSTILLATION OF EPIRUBICIN;  Surgeon: Brooks Donnice, MD;  Location: [facility];  Service: Urology;  Laterality: N/A;   CYSTOSCOPY WITH FULGERATION N/A 09/06/2023   Procedure: CYSTOSCOPY, WITH BLADDER FULGURATION AND BIOPSY;  Surgeon: Brooks Donnice, MD;  Location: [facility];  Service: Urology;  Laterality: N/A;  BLADDER BIOPSY AND FULGURATION WITH INSTILLATION OF GEMCITABINE    ESOPHAGOGASTRODUODENOSCOPY (EGD) WITH PROPOFOL  N/A 04/07/2020   Procedure: ESOPHAGOGASTRODUODENOSCOPY (EGD) WITH PROPOFOL ;  Surgeon: Toledo, Ladell POUR, MD;  Location: [facility] ENDOSCOPY;  Service: Gastroenterology;  Laterality: N/A;   EYE SURGERY  07/2013   excision cyst right eyelid   LUMBAR LAMINECTOMY/DECOMPRESSION MICRODISCECTOMY  06/07/2011   Procedure: LUMBAR LAMINECTOMY/DECOMPRESSION MICRODISCECTOMY 2 LEVELS;  Surgeon: Arley SHAUNNA Helling, MD;  Location: [facility];  Service: Neurosurgery;  Laterality: Left;   Lumbar Two-Three, Lumbar Three-Four Decompressive Lumbar Laminectomy   SHOULDER  ARTHROSCOPY WITH OPEN ROTATOR CUFF REPAIR Right 02/20/2015   Procedure: SHOULDER ARTHROSCOPY WITH OPEN ROTATOR CUFF REPAIR and decompression;  Surgeon: Norleen JINNY Maltos, MD;  Location: [facility];  Service: Orthopedics;  Laterality: Right;   TONSILLECTOMY AND ADENOIDECTOMY  1956   TRANSURETHRAL RESECTION OF BLADDER TUMOR N/A 10/14/2020   Procedure: TRANSURETHRAL RESECTION OF BLADDER TUMOR (TURBT)/ CYSTOSCOPY/ POST OPERATIVE INSTILLATION OF GEMCITABINE / BILATERAL RETROGRADE;  Surgeon: Brooks Donnice, MD;  Location: [facility];  Service: Urology;  Laterality: N/A;  ONLY NEEDS 60 MIN   TRANSURETHRAL RESECTION OF BLADDER TUMOR WITH MITOMYCIN -C N/A 02/05/2014   Procedure: TRANSURETHRAL RESECTION OF BLADDER TUMOR ;  Surgeon: Donnice Brooks, MD;  Location[facility];  Service: Urology;  Laterality: N/A;   VAGINAL HYSTERECTOMY  1976     Current Outpatient Medications  Medication Sig Dispense Refill   acetaminophen  (TYLENOL ) 500 MG tablet Take  1,000 mg by mouth every 6 (six) hours as needed for moderate pain.     albuterol (VENTOLIN HFA) 108 (90 Base) MCG/ACT inhaler Inhale 2 puffs into the lungs every 4 (four) hours as needed for wheezing or shortness of breath.     aspirin  EC 81 MG tablet Take 81 mg by mouth daily.     atorvastatin  (LIPITOR ) 40 MG tablet Take 40 mg by mouth every evening.     benazepril  (LOTENSIN ) 40 MG tablet Take 40 mg by mouth every morning.     benzonatate (TESSALON) 200 MG capsule Take 200 mg by mouth 3 (three) times daily as needed for cough.     Calcipotriene  0.005 % solution Apply 1 Application topically at bedtime. Apply up to 5 days per week can apply over Fluocinonide  (Patient taking differently: Apply 1 Application topically at bedtime as needed (psoriais).) 120 mL 3   Camphor-Menthol -Methyl Sal (SALONPAS) 3.05-15-08 % PTCH Place 1 patch onto the skin daily as needed (Pain).     carvedilol  (COREG ) 25 MG tablet Take 25 mg by mouth 2 (two) times daily with a meal.      clobetasol  (TEMOVATE ) 0.05 % external solution Apply 1 Application topically 2 (two) times daily. Mix in 1 tub of Cerave cream, use qd/bid to aa psoriasis on trunk, arms, legs until clear, then prn flares, avoid face, groin, axilla (Patient taking differently: Apply 1 Application topically 2 (two) times daily as needed (psoriasis). Mix in 1 tub of Cerave cream, use qd/bid to aa psoriasis on trunk, arms, legs until clear, then prn flares, avoid face, groin, axilla) 50 mL 2   escitalopram  (LEXAPRO ) 20 MG tablet Take 20 mg by mouth at bedtime.     esomeprazole (NEXIUM) 40 MG capsule Take 40 mg by mouth at bedtime.     fluocinonide  (LIDEX ) 0.05 % external solution Apply to aa's scalp QHS up to 5 days per week. Avoid face, groin, and axilla. (Patient taking differently: Apply 1 Application topically daily as needed (psoriasis).  Avoid face, groin, and axilla.) 120 mL 3   fluticasone (FLONASE) 50 MCG/ACT nasal spray Place 2 sprays into both nostrils daily as needed for allergies.     hydrocortisone  2.5 % cream Apply to affected areas face, skin fold areas twice daily until rash improved. (Patient taking differently: Apply 1 Application topically 2 (two) times daily as needed (rash).) 30 g 1   hydrOXYzine  (ATARAX ) 10 MG tablet Take 1-3 tablets before bed as needed for itch. May make drowsy. 90 tablet 1   metFORMIN (GLUCOPHAGE-XR) 500 MG 24 hr tablet Take 500 mg by mouth daily with supper.     Multiple Vitamins-Minerals (MULTIVITAMINS THER. W/MINERALS) TABS Take 1 tablet by mouth daily.     nitroGLYCERIN  (NITROSTAT ) 0.4 MG SL tablet Place 0.4 mg under the tongue every 5 (five) minutes as needed for chest pain.     Secukinumab , 300 MG Dose, (COSENTYX  SENSOREADY, 300 MG,) 150 MG/ML SOAJ Inject 2 mLs (300 mg total) into the skin every 28 (twenty-eight) days. For maintenance. (Patient not taking: Reported on 11/15/2023) 2 mL 6   No current facility-administered medications for this visit.    Allergies:    Shellfish allergy, Aciphex [rabeprazole sodium], Biaxin [clarithromycin], Codeine, Colchicine, Evista [raloxifene], Levaquin [levofloxacin in d5w], Losartan, Myrbetriq [mirabegron], Prevacid [lansoprazole], Roxicodone  [oxycodone ], Sulfa antibiotics, Tetanus toxoids, Zoloft [sertraline hcl], and Amoxicillin    Social History:  The patient  reports that she quit smoking about 24 years ago. Her smoking use included cigarettes. She  started smoking about 29 years ago. She has a 1.3 pack-year smoking history. She has been exposed to tobacco smoke. She has never used smokeless tobacco. She reports that she does not drink alcohol and does not use drugs.   Family History:  The patient's family history is not on file.    ROS:  Please see the history of present illness.   Otherwise, review of systems are positive for none.   All other systems are reviewed and negative.    PHYSICAL EXAM: VS:  BP 116/60 (BP Location: Right Arm, Patient Position: Sitting, Cuff Size: Normal)   Pulse 63   Ht 5' (1.524 m)   Wt 150 lb (68 kg)   SpO2 98%   BMI 29.29 kg/m  , BMI Body mass index is 29.29 kg/m. GEN: Well nourished, well developed, in no acute distress  HEENT: normal  Neck: no JVD, carotid bruits, or masses Cardiac: RRR; no murmurs, rubs, or gallops,no edema  Respiratory:  clear to auscultation bilaterally, normal work of breathing GI: soft, nontender, nondistended, + BS MS: no deformity or atrophy  Skin: warm and dry, no rash Neuro:  Strength and sensation are intact Psych: euthymic mood, full affect   EKG:  EKG is ordered today. The ekg ordered today demonstrates : Normal sinus rhythm Minimal voltage criteria for LVH, may be normal variant ( R in aVL ) When compared with ECG of 02-Sep-2023 15:00, Borderline criteria for Anterior infarct are no longer Present    Recent Labs: 09/02/2023: ALT 27; BUN 14; Creatinine, Ser 0.61; Hemoglobin 13.1; Platelets 196; Potassium 3.7; Sodium 138    Lipid  Panel No results found for: CHOL, TRIG, HDL, CHOLHDL, VLDL, LDLCALC, LDLDIRECT    Wt Readings from Last 3 Encounters:  11/15/23 150 lb (68 kg)  09/06/23 153 lb (69.4 kg)  09/02/23 153 lb (69.4 kg)         11/15/2023    2:07 PM  PAD Screen  Previous PAD dx? No  Previous surgical procedure? No  Pain with walking? No  Feet/toe relief with dangling? No  Painful, non-healing ulcers? No  Extremities discolored? No      ASSESSMENT AND PLAN:  1.  Coronary artery disease involving native coronary arteries without angina: She is doing extremely well with no recurrent anginal symptoms.  Continue aspirin  81 mg once daily.  No indication for ischemic cardiac evaluation.  2.  Hyperlipidemia: Continue atorvastatin  40 mg once daily.  Most recent lipid profile in April showed an LDL of 61.  Triglyceride was mildly elevated at 266 likely due to elevated hemoglobin A1c.  3.  Essential hypertension: Blood pressure is well-controlled.  4.  The patient might require back surgery in the near future.  She is at low risk from a cardiac standpoint as she has been stable with no anginal symptoms and functional capacity is good.    Disposition:   FU with me in 6 months  Signed,  Deatrice Cage, MD  11/15/2023 4:41 PM    [facility]

## 2023-11-15 NOTE — Patient Instructions (Signed)
 Medication Instructions:  No changes *If you need a refill on your cardiac medications before your next appointment, please call your pharmacy*  Lab Work: None ordered If you have labs (blood work) drawn today and your tests are completely normal, you will receive your results only by: MyChart Message (if you have MyChart) OR A paper copy in the mail If you have any lab test that is abnormal or we need to change your treatment, we will call you to review the results.  Testing/Procedures: None ordered  Follow-Up: At [facility], you and your health needs are our priority.  As part of our continuing mission to provide you with exceptional heart care, our providers are all part of one team.  This team includes your primary Cardiologist (physician) and Advanced Practice Providers or APPs (Physician Assistants and Nurse Practitioners) who all work together to provide you with the care you need, when you need it.  Your next appointment:   6 month(s)  Provider:   You may see Dr. Alvenia Aus or one of the following Advanced Practice Providers on your designated Care Team:   Laneta Pintos, NP Gildardo Labrador, PA-C Varney Gentleman, PA-C Cadence [place], PA-C Ronald Cockayne, NP Morey Ar, NP    We recommend signing up for the patient portal called MyChart.  Sign up information is provided on this After Visit Summary.  MyChart is used to connect with patients for Virtual Visits (Telemedicine).  Patients are able to view lab/test results, encounter notes, upcoming appointments, etc.  Non-urgent messages can be sent to your provider as well.   To learn more about what you can do with MyChart, go to [email or web address].

## 2023-11-23 DIAGNOSIS — E785 Hyperlipidemia, unspecified: Secondary | ICD-10-CM | POA: Diagnosis not present

## 2023-11-23 DIAGNOSIS — E1169 Type 2 diabetes mellitus with other specified complication: Secondary | ICD-10-CM | POA: Diagnosis not present

## 2023-11-23 DIAGNOSIS — E782 Mixed hyperlipidemia: Secondary | ICD-10-CM | POA: Diagnosis not present

## 2023-11-29 DIAGNOSIS — M4802 Spinal stenosis, cervical region: Secondary | ICD-10-CM | POA: Diagnosis not present

## 2023-11-29 DIAGNOSIS — M4854XA Collapsed vertebra, not elsewhere classified, thoracic region, initial encounter for fracture: Secondary | ICD-10-CM | POA: Diagnosis not present

## 2023-11-29 DIAGNOSIS — M5412 Radiculopathy, cervical region: Secondary | ICD-10-CM | POA: Diagnosis not present

## 2023-11-30 DIAGNOSIS — E782 Mixed hyperlipidemia: Secondary | ICD-10-CM | POA: Diagnosis not present

## 2023-11-30 DIAGNOSIS — E119 Type 2 diabetes mellitus without complications: Secondary | ICD-10-CM | POA: Diagnosis not present

## 2023-11-30 DIAGNOSIS — I10 Essential (primary) hypertension: Secondary | ICD-10-CM | POA: Diagnosis not present

## 2023-11-30 DIAGNOSIS — F419 Anxiety disorder, unspecified: Secondary | ICD-10-CM | POA: Diagnosis not present

## 2023-12-02 DIAGNOSIS — M5412 Radiculopathy, cervical region: Secondary | ICD-10-CM | POA: Diagnosis not present

## 2023-12-26 ENCOUNTER — Telehealth: Payer: Self-pay | Admitting: Cardiovascular Disease

## 2023-12-26 ENCOUNTER — Other Ambulatory Visit: Payer: Self-pay | Admitting: Cardiovascular Disease

## 2023-12-26 NOTE — Telephone Encounter (Signed)
*  STAT* If patient is at the pharmacy, call can be transferred to refill team.   1. Which medications need to be refilled? (please list name of each medication and dose if known)  atorvastatin  (LIPITOR ) 40 MG tablet   benazepril  (LOTENSIN ) 40 MG tablet   carvedilol  (COREG ) 25 MG tablet    2. Which pharmacy/location (including street and city if local pharmacy) is medication to be sent to? [facility] - [place], [place] - [address]   3. Do they need a 30 day or 90 day supply? 90

## 2023-12-27 MED ORDER — BENAZEPRIL HCL 40 MG PO TABS: 40.0000 mg | ORAL_TABLET | Freq: Every morning | ORAL | 3 refills | Status: AC

## 2023-12-27 MED ORDER — ATORVASTATIN CALCIUM 40 MG PO TABS: 40.0000 mg | ORAL_TABLET | Freq: Every evening | ORAL | 3 refills | Status: AC

## 2023-12-27 MED ORDER — CARVEDILOL 25 MG PO TABS: 25.0000 mg | ORAL_TABLET | Freq: Two times a day (BID) | ORAL | 3 refills | Status: AC

## 2023-12-27 NOTE — Telephone Encounter (Signed)
 Pt recently saw Dr. Darron, refills have been sent to [facility].

## 2023-12-30 DIAGNOSIS — J301 Allergic rhinitis due to pollen: Secondary | ICD-10-CM | POA: Diagnosis not present

## 2023-12-30 DIAGNOSIS — R490 Dysphonia: Secondary | ICD-10-CM | POA: Diagnosis not present

## 2024-01-04 DIAGNOSIS — Z8551 Personal history of malignant neoplasm of bladder: Secondary | ICD-10-CM | POA: Diagnosis not present

## 2024-01-04 DIAGNOSIS — C671 Malignant neoplasm of dome of bladder: Secondary | ICD-10-CM | POA: Diagnosis not present

## 2024-02-15 ENCOUNTER — Ambulatory Visit

## 2024-02-15 DIAGNOSIS — L4050 Arthropathic psoriasis, unspecified: Secondary | ICD-10-CM | POA: Diagnosis not present

## 2024-02-15 MED ORDER — TRIAMCINOLONE ACETONIDE 0.1 % EX CREA: TOPICAL_CREAM | CUTANEOUS | 2 refills | Status: DC

## 2024-02-15 MED ORDER — CLOBETASOL PROPIONATE 0.05 % EX OINT: TOPICAL_OINTMENT | CUTANEOUS | 5 refills | Status: AC

## 2024-02-15 NOTE — Patient Instructions (Addendum)
 Otezla - less immunosuppressive. Has GI side effects. Can worsen anxiety and depression   Tremfya - another injectable that works on different pathway than cosentyx    Skyrizi - another injectable that works on different pathway than cosentyx   Sotyktu - a pill medication -Overall potential side effects are minimal and include upper respiratory infections, cold sores, and mild acne/folliculitis. Labs are checked before starting, including TB screening test,       Due to recent changes in healthcare laws, you may see results of your pathology and/or laboratory studies on MyChart before the doctors have had a chance to review them. We understand that in some cases there may be results that are confusing or concerning to you. Please understand that not all results are received at the same time and often the doctors may need to interpret multiple results in order to provide you with the best plan of care or course of treatment. Therefore, we ask that you please give us  2 business days to thoroughly review all your results before contacting the office for clarification. Should we see a critical lab result, you will be contacted sooner.   If You Need Anything After Your Visit  If you have any questions or concerns for your doctor, please call our main line at [phone] and press option 4 to reach your doctor's medical assistant. If no one answers, please leave a voicemail as directed and we will return your call as soon as possible. Messages left after 4 pm will be answered the following business day.   You may also send us  a message via MyChart. We typically respond to MyChart messages within 1-2 business days.  For prescription refills, please ask your pharmacy to contact our office. Our fax number is [phone].  If you have an urgent issue when the clinic is closed that cannot wait until the next business day, you can page your doctor at the number below.    Please note that while we do our  best to be available for urgent issues outside of office hours, we are not available 24/7.   If you have an urgent issue and are unable to reach us , you may choose to seek medical care at your doctor's office, retail clinic, urgent care center, or emergency room.  If you have a medical emergency, please immediately call 911 or go to the emergency department.  Pager Numbers  - Dr. Hester: [phone]  - Dr. Jackquline: [phone]  - Dr. Claudene: [phone]   In the event of inclement weather, please call our main line at [phone] for an update on the status of any delays or closures.  Dermatology Medication Tips: Please keep the boxes that topical medications come in in order to help keep track of the instructions about where and how to use these. Pharmacies typically print the medication instructions only on the boxes and not directly on the medication tubes.   If your medication is too expensive, please contact our office at [phone] option 4 or send us  a message through MyChart.   We are unable to tell what your co-pay for medications will be in advance as this is different depending on your insurance coverage. However, we may be able to find a substitute medication at lower cost or fill out paperwork to get insurance to cover a needed medication.   If a prior authorization is required to get your medication covered by your insurance company, please allow us  1-2 business days to complete this process.  Drug prices  often vary depending on where the prescription is filled and some pharmacies may offer cheaper prices.  The website www.goodrx.com contains coupons for medications through different pharmacies. The prices here do not account for what the cost may be with help from insurance (it may be cheaper with your insurance), but the website can give you the price if you did not use any insurance.  - You can print the associated coupon and take it with your prescription to the  pharmacy.  - You may also stop by our office during regular business hours and pick up a GoodRx coupon card.  - If you need your prescription sent electronically to a different pharmacy, notify our office through [facility] or by phone at [phone] option 4.     Si Usted Necesita Algo Despus de Su Visita  Tambin puede enviarnos un mensaje a travs de Clinical cytogeneticist. Por lo general respondemos a los mensajes de MyChart en el transcurso de 1 a 2 das hbiles.  Para renovar recetas, por favor pida a su farmacia que se ponga en contacto con nuestra oficina. Randi lakes de fax es [place] [phone].  Si tiene un asunto urgente cuando la clnica est cerrada y que no puede esperar hasta el siguiente da hbil, puede llamar/localizar a su doctor(a) al nmero que aparece a continuacin.   Por favor, tenga en cuenta que aunque hacemos todo lo posible para estar disponibles para asuntos urgentes fuera del horario de [place], no estamos disponibles las 24 horas del da, los 7 [address]  [address] de la [place].   Si tiene un problema urgente y no puede comunicarse con nosotros, puede optar por buscar atencin mdica  en el consultorio de su doctor(a), en una clnica privada, en un centro de atencin urgente o en una sala de emergencias.  Si tiene Engineer, drilling, por favor llame inmediatamente al 911 o vaya a la sala de emergencias.  Nmeros de bper  - Dr. Hester: [phone]  - Dra. Jackquline: [phone]  - Dr. Claudene: [phone]   En caso de inclemencias del tiempo, por favor llame a landry capes principal al [phone] para una actualizacin sobre el [place] de cualquier retraso o cierre.  Consejos para la medicacin en dermatologa: Por favor, guarde las cajas en las que vienen los medicamentos de uso tpico para ayudarle a seguir las instrucciones sobre dnde y cmo usarlos. Las farmacias generalmente imprimen las instrucciones del medicamento slo en las cajas y no directamente en los  tubos del [place].   Si su medicamento es muy caro, por favor, pngase en contacto con landry rieger llamando al [phone] y presione la opcin 4 o envenos un mensaje a travs de Clinical cytogeneticist.   No podemos decirle cul ser su copago por los medicamentos por adelantado ya que esto es diferente dependiendo de la cobertura de su seguro. Sin embargo, es posible que podamos encontrar un medicamento sustituto a Audiological scientist un formulario para que el seguro cubra el medicamento que se considera necesario.   Si se requiere una autorizacin previa para que su compaa de seguros malta su medicamento, por favor permtanos de 1 a 2 das hbiles para completar este proceso.  Los precios de los medicamentos varan con frecuencia dependiendo del Environmental consultant de dnde se surte la receta y alguna farmacias pueden ofrecer precios ms baratos.  El sitio web www.goodrx.com tiene cupones para medicamentos de Health and safety inspector. Los precios aqu no tienen en cuenta lo que podra costar con la ayuda del seguro (puede ser ms barato  con su seguro), pero el sitio web puede darle el precio si no Visual merchandiser.  - Puede imprimir el cupn correspondiente y llevarlo con su receta a la farmacia.  - Tambin puede pasar por nuestra oficina durante el horario de atencin regular y Education officer, museum una tarjeta de cupones de GoodRx.  - Si necesita que su receta se enve electrnicamente a una farmacia diferente, informe a nuestra oficina a travs de MyChart de  o por telfono llamando al [phone] y presione la opcin 4.

## 2024-02-15 NOTE — Progress Notes (Signed)
 Subjective   Meagan Osborne is a 78 y.o. female who presents for the following: Follow up of psoriasis. Patient is established patient .  Today patient reports: Patient has been on Cosentyx  injections for psoriasis, d/c first week of June 2025 due to being sick all the time sinus infections, exhaustion, within two weeks started flaring all over body, worst flare is at legs. Currently using Eucerin advanced repair and at night coats herself with Cerave moisturizer and helps with scaling.    Review of Systems:    No other skin or systemic complaints except as noted in HPI or Assessment and Plan.  The following portions of the chart were reviewed this encounter and updated as appropriate: medications, allergies, medical history  Relevant Medical History:  n/a   Objective  Well appearing patient in no apparent distress; mood and affect are within normal limits. 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 Condition is symptomatic and bothersome to patient. Patient is flaring and not currently at treatment goal. Requires long term med management  - Previously on cosentyx  but stopped in June 2025 due to feeling sick all the time - currently flaring off treatment. Hesitant to restart systemic treatment.  - Educated, discussed chronic nature and waxing/waning. - Discussed association with psoriatic arthritis, monitor for increasing joint pain/stiffness, red/hot swollen fingers or joints; discussed if develops joint involvement will need referral to Rheumatology and  possible escalation of therapy - Discussed different treatments including topical corticosteroids, and/or topical vitamin D vs nbUVB vs systemic therapy (CsA, MTX, etanercept, adalimumab, ustekinumab, ixekizumab, apremilast, sotykty, skyrizi) -Patient defers trying another injectable at this time would want to do some research on options and return for follow-up appointment with decision. Has history of bladder cancer, coronary artery disease s/p stents. Notes occasional GI sx. No hx of anxiety or depression.  -Referral placed to rheumatology for PsA  - Start clobetasol  ointment 0.05% twice daily to affected skin Discussed side effect of super potent topical steroids including atrophy, dyspigmentation, striae, telangectasia, folliculitis, loss of skin pigment, hair growth, tachyphylaxis, risk of systemic absorption with missuse. start triamcinolone cream 0.1% twice daily to affected areas of skin Discussed side effect of potent topical steroids including atrophy, dyspigmentation, striae, telangectasia, folliculitis, loss of skin pigment, hair growth, tachyphylaxis, risk of systemic absorption with missuse.    Level of service outlined above   Procedures, orders, diagnosis for this visit:  PSORIATIC ARTHRITIS (HCC)   Related Procedures Ambulatory referral to Rheumatology  Psoriatic arthritis [facility]) -     Ambulatory referral to Rheumatology  Other orders -     Clobetasol  Propionate; Apply 1 gram topically to affected area of skin twice daily. Stop once resolved and restart as needed for flares. Avoid use on face, armpits, groin unless otherwise indicated.  Dispense: 60 g; Refill: 5 -     Triamcinolone Acetonide; Apply 7 gram twice daily to affected areas of skin. Stop once resolved and restart as needed for flares. Avoid use on face, armpits, groin unless otherwise indicated.  Dispense: 454 g; Refill: 2  Return to clinic: Return for 3-4 weeks psoriasis follow-up , w/ Dr.  Raymund.  Documentation:  I, Jacquelynn V. Wilfred, CMA, am acting as scribe for Lauraine JAYSON Raymund, MD .  I have reviewed the above documentation for accuracy and completeness, and I agree with the above.  Lauraine JAYSON Raymund, MD

## 2024-02-27 DIAGNOSIS — Z1231 Encounter for screening mammogram for malignant neoplasm of breast: Secondary | ICD-10-CM | POA: Diagnosis not present

## 2024-03-07 ENCOUNTER — Ambulatory Visit

## 2024-03-08 ENCOUNTER — Ambulatory Visit

## 2024-03-08 DIAGNOSIS — L4050 Arthropathic psoriasis, unspecified: Secondary | ICD-10-CM

## 2024-03-08 DIAGNOSIS — L409 Psoriasis, unspecified: Secondary | ICD-10-CM | POA: Diagnosis not present

## 2024-03-08 MED ORDER — TRIAMCINOLONE ACETONIDE 0.1 % EX CREA: TOPICAL_CREAM | CUTANEOUS | 2 refills | Status: AC

## 2024-03-08 NOTE — Progress Notes (Signed)
    Subjective   Meagan Osborne is a 78 y.o. female who presents for the following: Follow up of psoriasis. Patient is established patient   Today patient reports: Patient reports she is doing a little better, only using triamcinolone 0.1% cream twice daily, has not used clobetasol  ointment. LOC at ears and face. Patient does have upcoming appointment with rheumatology on November 12th.   Review of Systems:    No other skin or systemic complaints except as noted in HPI or Assessment and Plan.  The following portions of the chart were reviewed this encounter and updated as appropriate: medications, allergies, medical history  Relevant Medical History:  n/a   Objective  Well appearing patient in no apparent distress; mood and affect are within normal limits. Examination was performed of the: Focused Exam of: Upper and lower extremities   Examination notable for: Psoriasis: Well circumscribed erythematous papules and plaques with overlying silvery scale on flexural surfaces including elbows, knees, gluteal cleft   Examination limited by: Undergarments, Shoes or socks , Clothing, and Patient deferred removal       Assessment & Plan   Follow up of psoriasis.   Psoriasis - - moderate, BSA% >10 with c/f PsA  Chronic and persistent condition with duration or expected duration over one year. Condition is symptomatic and bothersome to patient. Patient is flaring and not currently at treatment goal.  - Previously on cosentyx  but stopped in June 2025 due to feeling sick all the time - currently flaring off treatment. Hesitant to restart systemic treatment.  - Educated, discussed chronic nature and waxing/waning. - Discussed association with psoriatic arthritis, monitor for increasing joint pain/stiffness, red/hot swollen fingers or joints - patient has upcoming appointment to see rheumatology. - Again discussed different systemic treatments including topical corticosteroids, and/or topical  vitamin D vs nbUVB vs systemic therapy (CsA, MTX, etanercept, adalimumab, ustekinumab, ixekizumab, apremilast, sotykty, skyrizi) -Patient continues to defer systemic tx. Hesitant of side effects.   - We discussed if there is e/o PsA at Rheumatology visit would strongly recommend initiation of systemic medication  - Continue clobetasol  ointment 0.05% twice daily to affected skin - more severe areas  Continue triamcinolone cream 0.1% twice daily to affected areas of skin - more mild areas  Discussed side effect of potent topical steroids including atrophy, dyspigmentation, striae, telangectasia, folliculitis, loss of skin pigment, hair growth, tachyphylaxis, risk of systemic absorption with missuse.    Level of service outlined above   Procedures, orders, diagnosis for this visit:    There are no diagnoses linked to this encounter.  Return to clinic: Return in about 4 months (around 07/07/2024) for Psoriasis, w/ Dr. Raymund.  I, Jacquelynn V. Wilfred, CMA, am acting as scribe for Lauraine JAYSON Raymund, MD.   Documentation: I have reviewed the above documentation for accuracy and completeness, and I agree with the above.  Lauraine JAYSON Raymund, MD

## 2024-03-08 NOTE — Patient Instructions (Signed)

## 2024-04-03 ENCOUNTER — Ambulatory Visit: Admitting: Dermatology

## 2024-06-27 ENCOUNTER — Ambulatory Visit

## 2024-09-12 ENCOUNTER — Ambulatory Visit: Admitting: Cardiovascular Disease
